# Patient Record
Sex: Male | Born: 1985
Health system: Southern US, Community
[De-identification: ages and names within clinical notes are randomized; demographics above are authoritative.]

## PROBLEM LIST (undated history)

## (undated) DIAGNOSIS — F909 Attention-deficit hyperactivity disorder, unspecified type: Secondary | ICD-10-CM

## (undated) DIAGNOSIS — F319 Bipolar disorder, unspecified: Secondary | ICD-10-CM

## (undated) HISTORY — PX: APPENDECTOMY: SHX54

## (undated) HISTORY — PX: WISDOM TOOTH EXTRACTION: SHX21

---
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
## 2006-10-22 ENCOUNTER — Emergency Department (HOSPITAL_COMMUNITY): Admission: EM | Admit: 2006-10-22 | Discharge: 2006-10-22 | Payer: Self-pay | Admitting: Emergency Medicine

## 2007-09-22 ENCOUNTER — Encounter: Payer: Self-pay | Admitting: Family Medicine

## 2007-12-04 ENCOUNTER — Emergency Department (HOSPITAL_COMMUNITY): Admission: EM | Admit: 2007-12-04 | Discharge: 2007-12-04 | Payer: Self-pay | Admitting: Emergency Medicine

## 2007-12-05 ENCOUNTER — Ambulatory Visit (HOSPITAL_COMMUNITY): Admission: RE | Admit: 2007-12-05 | Discharge: 2007-12-05 | Payer: Self-pay | Admitting: Specialist

## 2008-01-06 ENCOUNTER — Encounter: Admission: RE | Admit: 2008-01-06 | Discharge: 2008-03-15 | Payer: Self-pay | Admitting: Specialist

## 2008-07-13 ENCOUNTER — Ambulatory Visit: Payer: Self-pay | Admitting: Family Medicine

## 2008-07-13 DIAGNOSIS — F319 Bipolar disorder, unspecified: Secondary | ICD-10-CM

## 2008-07-13 DIAGNOSIS — F988 Other specified behavioral and emotional disorders with onset usually occurring in childhood and adolescence: Secondary | ICD-10-CM | POA: Insufficient documentation

## 2009-06-19 ENCOUNTER — Encounter (INDEPENDENT_AMBULATORY_CARE_PROVIDER_SITE_OTHER): Payer: Self-pay | Admitting: *Deleted

## 2009-06-19 DIAGNOSIS — F17200 Nicotine dependence, unspecified, uncomplicated: Secondary | ICD-10-CM

## 2009-08-09 ENCOUNTER — Ambulatory Visit: Payer: Self-pay | Admitting: Family Medicine

## 2009-08-09 DIAGNOSIS — K219 Gastro-esophageal reflux disease without esophagitis: Secondary | ICD-10-CM | POA: Insufficient documentation

## 2009-08-23 ENCOUNTER — Encounter: Payer: Self-pay | Admitting: *Deleted

## 2009-08-23 DIAGNOSIS — M25519 Pain in unspecified shoulder: Secondary | ICD-10-CM | POA: Insufficient documentation

## 2009-10-18 ENCOUNTER — Encounter: Admission: RE | Admit: 2009-10-18 | Discharge: 2009-11-29 | Payer: Self-pay | Admitting: Specialist

## 2010-06-04 ENCOUNTER — Emergency Department (HOSPITAL_COMMUNITY)
Admission: EM | Admit: 2010-06-04 | Discharge: 2010-06-04 | Payer: Self-pay | Source: Home / Self Care | Admitting: Emergency Medicine

## 2010-07-19 NOTE — Miscellaneous (Signed)
Summary: needs referral  Clinical Lists Changes his mom called asking for a referral to [facility] with Dr. Thomasena Edis. she tried to make the appt but he needs to be referred there first..Sally Caldwell RN  August 23, 2009 2:08 PM [facility] referral is in chart. THANKS  [name] MD  August 25, 2009 9:17 AM  Problems: Added new problem of SHOULDER PAIN, RIGHT (ICD-719.41) - Signed Orders: Added new Referral order of Orthopedic Referral (Ortho) - Signed      Complete Medication List: 1)  Amantadine Hcl 100 Mg Tabs (Amantadine hcl) .Marland Kitchen.. 1 by mouth once daily per psychiatry 2)  Concerta 54 Mg Cr-tabs (Methylphenidate hcl) .... 2 by mouth once daily per psychiatry 3)  Abilify 20 Mg Tabs (Aripiprazole) .Marland Kitchen.. 1 by mouth once daily per psychiatry   referral made with [facility].Loralee Pacas CMA  August 25, 2009 11:16 AM

## 2010-07-19 NOTE — Miscellaneous (Signed)
Summary: Tobacco Eric Logan  Clinical Lists Changes  Problems: Added new problem of TOBACCO [place] (ICD-305.1) 

## 2010-07-19 NOTE — Assessment & Plan Note (Signed)
Summary: CPE/KH   Vital Signs:  Patient profile:   25 year old male Height:      68 inches Weight:      190.2 pounds BMI:     29.02 Temp:     97.9 degrees F oral Pulse rate:   98 / minute BP sitting:   125 / 74  (left arm) Cuff size:   regular  Vitals Entered By: Gladstone Pih (August 09, 2009 8:49 AM) CC: CPE Is Patient Diabetic? No Pain Assessment Patient in pain? no        CC:  CPE.  History of Present Illness: here for check up has some right shoulder problems but is seeing an orthopedist for that noticed he has gained 33 pounds in last year---drinks at least one 2liter bottle of soda or green tea a day. Very ittle activity. Does complain of some epigastric pain at times, usually when her lays down to sleep. Notable eats right before bed time. Paini is localized to epigastric area, no nausea. he describes it as a "rib feels like it is moving or pressing inward". No SOB. Happens only at bedtime. Gone when he awakens. Being followed by psychiatry for mood disorder. Engineer, manufacturing systems)  Habits & Providers  Alcohol-Tobacco-Diet     Tobacco Status: current     Tobacco Counseling: to quit use of tobacco products     Other Tobacco cigar     Other per week 1  Current Medications (verified): 1)  Amantadine Hcl 100 Mg Tabs (Amantadine Hcl) .Marland Kitchen.. 1 By Mouth Once Daily Per Psychiatry 2)  Concerta 54 Mg Cr-Tabs (Methylphenidate Hcl) .... 2 By Mouth Once Daily Per Psychiatry 3)  Abilify 20 Mg Tabs (Aripiprazole) .Marland Kitchen.. 1 By Mouth Once Daily Per Psychiatry  Past History:  Past Medical History: Last updated: 07/13/2008 bipolar d/o, ADD and LD followed at [facility]  Past Surgical History: Last updated: 07/13/2008 fractured scapula  Review of Systems       The patient complains of weight gain.  The patient denies anorexia, fever, weight loss, chest pain, syncope, dyspnea on exertion, peripheral edema, prolonged cough, headaches, hemoptysis, abdominal pain,  incontinence, muscle weakness, and difficulty walking.    Physical Exam  General:  alert and well-developed.   Eyes:  vision grossly intact, pupils equal, and pupils round.   Ears:  R ear normal and L ear normal.   Nose:  no external deformity.   Mouth:  no exudate Neck:  supple, full ROM, no masses, no thyromegaly, and no carotid bruits.   Lungs:  normal respiratory effort and normal breath sounds.   Heart:  normal rate, regular rhythm, and no murmur.   Abdomen:  soft, no distention, no guarding, and no rigidity.  epigastric area is normal to exam--the area where he points to that corresponds to his pain is right over the GE junction. Genitalia:  deferred by pt Msk:  normal ROM, no joint swelling, and no joint warmth.   Extremities:  FROM x 4. normal symmetrical strength grossly Neurologic:  alert & oriented X3 and gait normal.   Skin:  complete skin exam deferred by pt. no worrisome lesions on hands, face trunk, arms Psych:  Oriented X3.  slightly flat affect. Very distractable (answers his phone during interview) Answers questions appropriately and co-operative.   Impression & Recommendations:  Problem # 1:  WELL ADULT EXAM (ICD-V70.0) Td is in date we discussed his weight and recommended decreasing his intake of soda and green tea--both from a weight  perspecitve and from a GERD prespective. I think the "rib moving" sensation he has at night is some mild reflux. Will try otc omeprazole oprn and lifestyle modificcation of diet. recommended more exercise  Complete Medication List: 1)  Amantadine Hcl 100 Mg Tabs (Amantadine hcl) .Marland Kitchen.. 1 by mouth once daily per psychiatry 2)  Concerta 54 Mg Cr-tabs (Methylphenidate hcl) .... 2 by mouth once daily per psychiatry 3)  Abilify 20 Mg Tabs (Aripiprazole) .Marland Kitchen.. 1 by mouth once daily per psychiatry [facility] - Est  18-39 yrs ([id number])   Impression & Recommendations:    Orders: [facility] - Est  18-39 yrs ([id number])     Complete Medication List: 1)   Amantadine Hcl 100 Mg Tabs (Amantadine hcl) .Marland Kitchen.. 1 by mouth once daily per psychiatry 2)  Concerta 54 Mg Cr-tabs (Methylphenidate hcl) .... 2 by mouth once daily per psychiatry 3)  Abilify 20 Mg Tabs (Aripiprazole) .Marland Kitchen.. 1 by mouth once daily per psychiatry

## 2010-07-26 ENCOUNTER — Encounter: Payer: Self-pay | Admitting: *Deleted

## 2010-09-20 ENCOUNTER — Telehealth: Payer: Self-pay | Admitting: Family Medicine

## 2010-09-20 NOTE — Telephone Encounter (Signed)
Wants to know what he can do for nausea

## 2010-09-20 NOTE — Telephone Encounter (Signed)
Pt states he has had nausea x 5 days. Denies vomiting.  States his "brain keeps going & going & going"  Attributes his nausea to the "brain thing" States eye twitching ( L  Eye) "I feel like I have a lot of adrenaline in me"  Has stopped all caffiene but no improvement. States he has not slept in days. States this has not happened before.   Area at hairline in the middle of forehead is throbbing. C/o tremors in hands and upper arms & thighs. I told him he needed to go to his mental health provider & the [facility] has walk ins 24/7. He had told me he could not reach his therapist today.     no abilify in 3 months. States "it does nothing for me" States he has had 2 people he know die in the past week & "can't stop thinking". Denies thoughts of suicide. Asked me to call his mom. She states she has been aware of this for 2 days & he has not stopped caffiene. One of the deaths was mom's friend & the other was a co-worker's boyfriend. States he is not close to either person.  States she thought he was calling for an appt here. Told her mental health was preferred & about the 24/7 walk in. She does not want to do this. States he is a hypochondriac. Says he used to see a therapist who is no longer there & when she gets off work she is going to find the person's number & see if he can be seen by that person. Called Brance back. He states he grew up with the man who overdosed & he does know him well. States this is the 2nd person he knew who overdosed.  States he cannot sleep because he keeps thinking about "every little thing in my life"  Has had diarrhea X5 days & dark urine. 4 stools so far today. States they look like "pond scum" States he is drinking plenty.  I explained to him that these symptoms are consistent with his bipolar condition & they will go away. Asked that he have his mom call me when she gets in which should be soon. He promised to call me back if he felt worse

## 2011-07-04 ENCOUNTER — Ambulatory Visit (INDEPENDENT_AMBULATORY_CARE_PROVIDER_SITE_OTHER): Payer: Medicaid Other | Admitting: Family Medicine

## 2011-07-04 ENCOUNTER — Other Ambulatory Visit: Payer: Self-pay

## 2011-07-04 ENCOUNTER — Ambulatory Visit (HOSPITAL_COMMUNITY)
Admission: RE | Admit: 2011-07-04 | Discharge: 2011-07-04 | Disposition: A | Discharge status: Home / Self Care | Payer: Medicaid Other | Source: Ambulatory Visit | Attending: Cardiology | Admitting: Cardiology

## 2011-07-04 VITALS — BP 127/79 | HR 89 | Temp 98.5°F | Ht 67.6 in | Wt 178.0 lb

## 2011-07-04 DIAGNOSIS — K5900 Constipation, unspecified: Secondary | ICD-10-CM | POA: Insufficient documentation

## 2011-07-04 DIAGNOSIS — R002 Palpitations: Secondary | ICD-10-CM

## 2011-07-04 NOTE — Assessment & Plan Note (Signed)
Discussed use of miralax, colace, and supportive measures for anal fissure and fiber supplements for ongoing maintenance of constipation.  Patient handout given.

## 2011-07-04 NOTE — Patient Instructions (Signed)
I will send EKG to [facility].  Your heart rate and EKG looked normal.  See info sheet on anal fissures and constipation

## 2011-07-04 NOTE — Assessment & Plan Note (Signed)
No palpitations or tachycardia noted on EKG or exam/history today.  Will forward to Rejeana Brock, NP at [facility] as requested.  No further workup or monitoring indicated.

## 2011-07-04 NOTE — Progress Notes (Signed)
  Subjective:    Patient ID: Eric Logan, male    DOB: 1986-03-17, 26 y.o.   MRN: [id number]  HPI Patient here for referral from [facility] to evaluate palpitations, the request an EKG. No recent change in medications, meds reviewed.  Palpitations:  Patient states he does not have any palpitations but frequently has a fast heart rate when seen.  States he has already taken his methylphenidate this morning as he does every morning.  No dyspnea at rest or on exertion.   No chest pain, edema.  Constipation:  Mom notes he currently has an anal fissure and frequently has constipation.  Patient reports frequent hard stools and low fiber diet.  Inquiring about what they can do.  No diarrhea currently, no abdominal pain, fever.  I have reviewed patient's  PMH, FH, and Social history and Medications as related to this visit.  Review of Systems See above.      Objective:   Physical Exam GEN: Alert & Oriented, No acute distress CV:  Regular Rate & Rhythm, no murmur Respiratory:  Normal work of breathing, CTAB Abd:  + BS, soft, no tenderness to palpation Ext: no pre-tibial edema  EKG: Rate 88 NSR,       Assessment & Plan:

## 2014-02-16 ENCOUNTER — Ambulatory Visit (INDEPENDENT_AMBULATORY_CARE_PROVIDER_SITE_OTHER): Payer: Medicare Other | Admitting: Family Medicine

## 2014-02-16 VITALS — BP 121/76 | HR 87 | Temp 98.1°F | Ht 68.0 in | Wt 216.5 lb

## 2014-02-16 DIAGNOSIS — F319 Bipolar disorder, unspecified: Secondary | ICD-10-CM

## 2014-02-16 DIAGNOSIS — M25511 Pain in right shoulder: Secondary | ICD-10-CM

## 2014-02-16 DIAGNOSIS — Z23 Encounter for immunization: Secondary | ICD-10-CM

## 2014-02-16 DIAGNOSIS — Z87891 Personal history of nicotine dependence: Secondary | ICD-10-CM

## 2014-02-16 DIAGNOSIS — J309 Allergic rhinitis, unspecified: Secondary | ICD-10-CM

## 2014-02-16 DIAGNOSIS — M25519 Pain in unspecified shoulder: Secondary | ICD-10-CM

## 2014-02-16 MED ORDER — FLUTICASONE PROPIONATE 50 MCG/ACT NA SUSP: 2.0000 | Freq: Every day | NASAL | Status: DC

## 2014-02-17 ENCOUNTER — Encounter: Payer: Self-pay | Admitting: Family Medicine

## 2014-02-17 DIAGNOSIS — Z87891 Personal history of nicotine dependence: Secondary | ICD-10-CM | POA: Insufficient documentation

## 2014-02-17 DIAGNOSIS — J309 Allergic rhinitis, unspecified: Secondary | ICD-10-CM | POA: Insufficient documentation

## 2014-02-17 NOTE — Assessment & Plan Note (Signed)
I think using over-the-counter Sudafed is 5 medicines orally. We'll also add fluticasone nasal spray see this improves his symptoms the

## 2014-02-17 NOTE — Assessment & Plan Note (Signed)
He is followed by psychiatry for this and his ADD and they're managing his medications.

## 2014-02-17 NOTE — Progress Notes (Signed)
   Subjective:    Patient ID: Eric Logan, male    DOB: Jul 13, 1985, 28 y.o.   MRN: [id number]  HPI Right shoulder pain. History of scapular fracture or several years ago and was seen by orthopedics. He evidently did 2 rounds of physical therapy but still has occasional posterior right shoulder pain. In the last month or so this is gotten worse. He has started doing a little bit of an exercise program under the tutelage of his roommate. Pain is posterior the top of shoulder blade. Does not radiate to arm or neck. No numbness or tingling in his hands. #2. History of bipolar disorder with attention deficit disorder followed by psychiatry. He continues to see them on a regular basis and they're making some medication changes but he cannot wear her the name of the medicine there starting him on. #3. Having occasional headaches, Baisley occurring the morning in the evening. It may be related to some nasal congestion. He's been using over-the-counter Sudafed with some relief.  Review of Systems No unusual weight change, fever, sweats, chills. No hallucinations.    Objective:   Physical Exam Vital signs are reviewed GENERAL: Well-developed male no acute distress Shoulder: Right. Rotator cuff strength is intact in all planes and normal range of motion. Mild tenderness to palpation over this period border of the scapula. I cannot reproduce his pain. Very slight asymmetry of the scapula with right scapulae more prominent medially but there is no true winging of the scapula. Distally he is neurovascularly intact in bilateral upper extremities. Marland Kitchen Psychiatric: Alert and oriented. He asked answers questions appropriately. No hallucinations. Speech is normal and fluency in content HEENT: Nasal mucosa is very slightly swollen no exudate. Sinuses are nontender to percussion. Oropharynx is clear without any sign of exudate. Neck is without lymphadenopathy. LUNGS: Clear to auscultation bilaterally CV: Regular  rate and rhythm no murmur       Assessment & Plan:

## 2014-02-17 NOTE — Assessment & Plan Note (Signed)
He seems pretty fixated on the shoulder pain. I think is musculoskeletal and minor but I think it will be relieved if he sees the orthopedist I will make that referral.

## 2014-02-18 ENCOUNTER — Telehealth: Payer: Self-pay | Admitting: *Deleted

## 2014-02-18 NOTE — Telephone Encounter (Signed)
Appointment set for Monday 03/07/2014 @ 10:30am with Dr. Thomasena Edis at [facility]. [address] phone# [phone]. Per patient call his aunt it ok to talk to her and let her know about the appoitnemnt

## 2014-02-23 NOTE — Telephone Encounter (Signed)
Called and informed aunt of appt info.  Altamese Dilling, BSN, RN-BC

## 2014-03-08 ENCOUNTER — Telehealth: Payer: Self-pay | Admitting: Family Medicine

## 2014-03-08 NOTE — Telephone Encounter (Signed)
Dear Eric Logan Team I am not sure what type of p is requesting. If it is a disability letter then  ,please ask him exactly what I'm supposed to base this on as he has no physical disabilities, is not malnourished, is not blind and is not missing any lambs. All of those diagnoses would warrant  A disability request letter for a food pantry-----but I can't find a diagnosis in his problem list that I can use.   It is unfortunate that he is having these issues but I cannot forge some type of medical documentation for him.   please let him know this.\ Eric Logan

## 2014-03-08 NOTE — Telephone Encounter (Signed)
Pt is receiving food stamps but they never last thru the month. He is going to the food bank but needs a letter say he could use the food pantry every month. He is using the [employer] at [employer] and [employer] the letter

## 2014-03-08 NOTE — Telephone Encounter (Signed)
Patient states that the letter is needed because he is on disability and has ADHD. States that his mother is the one who is requesting the letter for the food pantry for him.

## 2014-03-10 NOTE — Telephone Encounter (Signed)
ADHD does not qualify him.Neither does psychiatric disability. Sorry  [name]

## 2014-03-11 NOTE — Telephone Encounter (Signed)
Patient has been notified about this subject

## 2014-07-14 ENCOUNTER — Encounter: Payer: Self-pay | Admitting: Family Medicine

## 2014-07-14 ENCOUNTER — Ambulatory Visit (INDEPENDENT_AMBULATORY_CARE_PROVIDER_SITE_OTHER): Payer: Medicare Other | Admitting: Family Medicine

## 2014-07-14 VITALS — BP 138/81 | HR 84 | Temp 98.3°F | Ht 68.0 in | Wt 217.1 lb

## 2014-07-14 DIAGNOSIS — J020 Streptococcal pharyngitis: Secondary | ICD-10-CM

## 2014-07-14 DIAGNOSIS — J029 Acute pharyngitis, unspecified: Secondary | ICD-10-CM | POA: Diagnosis not present

## 2014-07-14 LAB — POCT RAPID STREP A (OFFICE): RAPID STREP A SCREEN: NEGATIVE

## 2014-07-14 MED ORDER — PENICILLIN V POTASSIUM 500 MG PO TABS: 500.0000 mg | ORAL_TABLET | Freq: Two times a day (BID) | ORAL | Status: DC

## 2014-07-14 NOTE — Progress Notes (Signed)
    Subjective   Eric Judie PetitM Logan is a 29 y.o. male that presents for a same day visit  SORE THROAT  Sore throat began 3-4 days ago. Pain is: sharp  Severity: 7-8 Medications tried: chloroseptic spray  Strep throat exposure: no STD exposure: no  Symptoms Fever: no Cough: yes Runny nose: no Muscle aches: no Swollen Glands: no Trouble breathing: no Drooling: accumulation of saliva due to sore throat but not drooling  Weight loss: no  Patient believes could be caused by: unsure   Review of Symptoms - see HPI PMH - Smoking status noted.      History  Substance Use Topics  . Smoking status: Former Games developermoker  . Smokeless tobacco: Never Used  . Alcohol Use: No    ROS Per HPI  Objective   BP 138/81 mmHg  Pulse 84  Temp(Src) 98.3 F (36.8 C) (Oral)  Ht 5\' 8"  (1.727 m)  Wt 217 lb 1.6 oz (98.476 kg)  BMI 33.02 kg/m2  General: Well appearing, NAD HEENT: no LAD, EOMI, PERRL, Tm's clear and intact, tonsillar exudates  Respiratory/Chest: CTAB, no wheezing  Cardiovascular: RRR, S1S2    Assessment and Plan   Please refer to problem based charting of assessment and plan

## 2014-07-14 NOTE — Patient Instructions (Signed)
Thank you for coming in,   Please let me know if you the antibiotics don't seem to be working. I have prescribed a 10 day supply.   If you have worsening pain or you can't swallow please give us a call.   You can continue to drink tea, gargle salt water and swallow honey.    Please feel free to call with any questions or concerns at any time, at [phone]. --Dr. Jordan LikesSchmitz

## 2014-07-15 DIAGNOSIS — J029 Acute pharyngitis, unspecified: Secondary | ICD-10-CM | POA: Insufficient documentation

## 2014-07-15 NOTE — Assessment & Plan Note (Signed)
Rapid strep neg but tonsillar exudates on exam. Will treat based on exam  - pen V K for 10 days  - encourage probiotics while taking ABX  - f/u if worsens or no improvement

## 2014-08-11 DIAGNOSIS — F902 Attention-deficit hyperactivity disorder, combined type: Secondary | ICD-10-CM | POA: Diagnosis not present

## 2014-08-11 DIAGNOSIS — F319 Bipolar disorder, unspecified: Secondary | ICD-10-CM | POA: Diagnosis not present

## 2014-12-07 ENCOUNTER — Telehealth: Payer: Self-pay | Admitting: Family Medicine

## 2014-12-07 DIAGNOSIS — F312 Bipolar disorder, current episode manic severe with psychotic features: Secondary | ICD-10-CM | POA: Diagnosis not present

## 2014-12-07 DIAGNOSIS — F902 Attention-deficit hyperactivity disorder, combined type: Secondary | ICD-10-CM | POA: Diagnosis not present

## 2014-12-07 DIAGNOSIS — F319 Bipolar disorder, unspecified: Secondary | ICD-10-CM | POA: Diagnosis not present

## 2014-12-07 NOTE — Telephone Encounter (Signed)
Mother called and would like a referral for her son to see a psychiatrist and [facility]. jw

## 2014-12-07 NOTE — Telephone Encounter (Signed)
Mother called back because she needs a referral to see [facility] Psychologist  to have a IQ test done. jw

## 2014-12-08 NOTE — Telephone Encounter (Signed)
He needs an appointment bwith me to discuss this THANKS! [name]

## 2014-12-09 NOTE — Telephone Encounter (Signed)
Spoke with patient and reminded him the appt for 12-21-14 with MD.  Also he told me that his aunt Kennon Rounds is now in charge of his affairs not his mom anymore.  Phone was disconnected.  Please have them fill out new forms reflecting this information.  Chaquita Basques,CMA

## 2014-12-09 NOTE — Telephone Encounter (Signed)
Pt calling back, says he was speaking with Dr. Donnetta Hail nurse and was disconnected, he will call back because he would prefer we talk to his aunt, Golden Circle, he will call back with her number.

## 2014-12-09 NOTE — Telephone Encounter (Signed)
Patient's aunt Golden Circle) calling to talk to Dr. Jennette Kettle, patient is supposed to be referred to have a IQ test done but she understands he needs to see Dr. Jennette Kettle for that and is agreeable. But patient also needs to switch psychiatrists for his monthly meds, he was asked to leave [facility] so patient needs a referral to [facility]. Cannot wait too long because patient only has 28 days of meds left and if he runs out he will have to go to ED for more meds. Please call Kennon Rounds so she can explain.

## 2014-12-09 NOTE — Telephone Encounter (Signed)
Will forward to Dr. Neal. Jazmin Hartsell,CMA  

## 2014-12-13 DIAGNOSIS — F902 Attention-deficit hyperactivity disorder, combined type: Secondary | ICD-10-CM | POA: Diagnosis not present

## 2014-12-13 DIAGNOSIS — F312 Bipolar disorder, current episode manic severe with psychotic features: Secondary | ICD-10-CM | POA: Diagnosis not present

## 2014-12-13 NOTE — Telephone Encounter (Signed)
Dear Eric Logan Team Insurance for behavioral health issues to see a psychiatrist does not usually need a referral. They should try calling the facility. What happened at [facility]? THANKS! Denny LevySara Jenascia Bumpass

## 2014-12-21 ENCOUNTER — Encounter: Payer: Self-pay | Admitting: Family Medicine

## 2014-12-21 ENCOUNTER — Ambulatory Visit (INDEPENDENT_AMBULATORY_CARE_PROVIDER_SITE_OTHER): Payer: Medicare Other | Admitting: Family Medicine

## 2014-12-21 VITALS — BP 133/82 | HR 88 | Temp 98.4°F | Ht 68.0 in | Wt 211.9 lb

## 2014-12-21 DIAGNOSIS — F319 Bipolar disorder, unspecified: Secondary | ICD-10-CM

## 2014-12-21 DIAGNOSIS — Z8659 Personal history of other mental and behavioral disorders: Secondary | ICD-10-CM

## 2014-12-21 NOTE — Telephone Encounter (Signed)
PT is scheduled to see Dr. Jennette KettleNeal on 12/21/14. Lamonte SakaiZimmerman Rumple, Camron Monday D, [place]

## 2014-12-22 DIAGNOSIS — Z8659 Personal history of other mental and behavioral disorders: Secondary | ICD-10-CM | POA: Insufficient documentation

## 2014-12-22 NOTE — Assessment & Plan Note (Signed)
I reviewed the available notes I have from Eric Logan's outpatient mental health providers. Unfortunately we don't have anything in recent years but I have his notes as a child. He's been in the system for most of his life. Various diagnoses but most consistent with adjustment disorder, oppositional defined disorder, attention deficit disorder. Certainly I think he needs to [facility]. I did call his therapist today and spoke with her while they were in clinic. She had one me to order some type of neuropsych testing to determine whether not he was on the autism spectrum. Given his age, I don't think that's going to be something easily determined. I would much rather get with his psychiatrist regarding these issues and I have conveyed this information to them. Greater than 50% of our 35 minute office visit was spent in counseling and education regarding these issues.

## 2014-12-22 NOTE — Assessment & Plan Note (Signed)
I discussed with him and he is on that given his current insurance, there is no need for referral. Unfortunately there are only a few psychiatrist's in the area who take Medicaid insurance. I recommend he not discontinue his current psychiatric care as I am unwilling to manage his complex psychiatric medication regimen and I do not think he needs to abruptly go off his medicines. He is not happy about having to [facility] with [facility], and I gave him a list of other options to explore.

## 2014-12-22 NOTE — Progress Notes (Signed)
   Subjective:    Patient ID: Cecile Hearingichard M Kahre, male    DOB: 06/21/1985, 29 y.o.   MRN: [id number]  HPI Here today with his on it, Lanetta InchSally Moore. They are requesting referral to get a new psychiatrist as he no longer wants to go to [facility]. He tells me that [facility] they're going to make some significant changes in his chronic mental health medications and he does not want to do that. His insurance is Medicaid. #2. He [facility] tear [facility]. She has told him that he needs to get some type of testing to evaluate whether or not he has Aspergers. Evidently he could qualify for some additional assistance through some state program if he has diagnosis that places him on the autism spectrum disorder. #3. Right now he's taking his medications regularly. He has another 2 weeks worth of medicines but does not want to go back to his previous psychiatrist. He denies any suicidal or homicidal ideation. He is not currently working outside the home.   Review of Systems No unusual weight change, fever, sweats, chills. See history of present illness.    Objective:   Physical Exam  Vital signs reviewed. GENERAL: Well-developed, well-nourished, no acute distress. CARDIOVASCULAR: Regular rate and rhythm no murmur gallop or rub LUNGS: Clear to auscultation bilaterally, no rales or wheeze. ABDOMEN: Soft positive bowel sounds NEURO: No gross focal neurological deficits. MSK: Movement of extremity x 4. PSYCHIATRIC: Alert and oriented 4. His speech at times is pressured and redundant. He gets quite excited when talking about certain subjects such as his current psychiatrist and they're plan to change his medication regimen. His thought process seems normal although at times he does perseverate.        Assessment & Plan:

## 2015-01-05 DIAGNOSIS — F902 Attention-deficit hyperactivity disorder, combined type: Secondary | ICD-10-CM | POA: Diagnosis not present

## 2015-01-05 DIAGNOSIS — F319 Bipolar disorder, unspecified: Secondary | ICD-10-CM | POA: Diagnosis not present

## 2015-03-29 DIAGNOSIS — F902 Attention-deficit hyperactivity disorder, combined type: Secondary | ICD-10-CM | POA: Diagnosis not present

## 2015-06-01 DIAGNOSIS — F902 Attention-deficit hyperactivity disorder, combined type: Secondary | ICD-10-CM | POA: Diagnosis not present

## 2015-06-01 DIAGNOSIS — F319 Bipolar disorder, unspecified: Secondary | ICD-10-CM | POA: Diagnosis not present

## 2015-06-21 DIAGNOSIS — F902 Attention-deficit hyperactivity disorder, combined type: Secondary | ICD-10-CM | POA: Diagnosis not present

## 2015-07-20 DIAGNOSIS — F902 Attention-deficit hyperactivity disorder, combined type: Secondary | ICD-10-CM | POA: Diagnosis not present

## 2015-07-20 DIAGNOSIS — F319 Bipolar disorder, unspecified: Secondary | ICD-10-CM | POA: Diagnosis not present

## 2015-08-25 DIAGNOSIS — F902 Attention-deficit hyperactivity disorder, combined type: Secondary | ICD-10-CM | POA: Diagnosis not present

## 2015-08-25 DIAGNOSIS — F319 Bipolar disorder, unspecified: Secondary | ICD-10-CM | POA: Diagnosis not present

## 2015-09-21 DIAGNOSIS — F902 Attention-deficit hyperactivity disorder, combined type: Secondary | ICD-10-CM | POA: Diagnosis not present

## 2015-10-24 DIAGNOSIS — F319 Bipolar disorder, unspecified: Secondary | ICD-10-CM | POA: Diagnosis not present

## 2015-10-24 DIAGNOSIS — F902 Attention-deficit hyperactivity disorder, combined type: Secondary | ICD-10-CM | POA: Diagnosis not present

## 2015-11-16 DIAGNOSIS — F902 Attention-deficit hyperactivity disorder, combined type: Secondary | ICD-10-CM | POA: Diagnosis not present

## 2015-12-27 DIAGNOSIS — F902 Attention-deficit hyperactivity disorder, combined type: Secondary | ICD-10-CM | POA: Diagnosis not present

## 2015-12-27 DIAGNOSIS — F319 Bipolar disorder, unspecified: Secondary | ICD-10-CM | POA: Diagnosis not present

## 2015-12-28 ENCOUNTER — Emergency Department (HOSPITAL_COMMUNITY)
Admission: EM | Admit: 2015-12-28 | Discharge: 2015-12-28 | Disposition: A | Discharge status: Home / Self Care | Payer: Medicare Other | Attending: Emergency Medicine | Admitting: Emergency Medicine

## 2015-12-28 ENCOUNTER — Emergency Department (HOSPITAL_COMMUNITY): Payer: Medicare Other

## 2015-12-28 ENCOUNTER — Encounter (HOSPITAL_COMMUNITY): Payer: Self-pay

## 2015-12-28 DIAGNOSIS — J029 Acute pharyngitis, unspecified: Secondary | ICD-10-CM | POA: Diagnosis not present

## 2015-12-28 DIAGNOSIS — Z79899 Other long term (current) drug therapy: Secondary | ICD-10-CM | POA: Diagnosis not present

## 2015-12-28 DIAGNOSIS — J040 Acute laryngitis: Secondary | ICD-10-CM | POA: Insufficient documentation

## 2015-12-28 DIAGNOSIS — F319 Bipolar disorder, unspecified: Secondary | ICD-10-CM | POA: Insufficient documentation

## 2015-12-28 DIAGNOSIS — J069 Acute upper respiratory infection, unspecified: Secondary | ICD-10-CM | POA: Insufficient documentation

## 2015-12-28 DIAGNOSIS — Z87891 Personal history of nicotine dependence: Secondary | ICD-10-CM | POA: Insufficient documentation

## 2015-12-28 HISTORY — DX: Bipolar disorder, unspecified: F31.9

## 2015-12-28 HISTORY — DX: Attention-deficit hyperactivity disorder, unspecified type: F90.9

## 2015-12-28 LAB — CBC WITH DIFFERENTIAL/PLATELET
BASOS ABS: 0 10*3/uL (ref 0.0–0.1)
BASOS PCT: 1 %
Eosinophils Absolute: 0.4 10*3/uL (ref 0.0–0.7)
Eosinophils Relative: 5 %
HEMATOCRIT: 44.4 % (ref 39.0–52.0)
Hemoglobin: 15.4 g/dL (ref 13.0–17.0)
Lymphocytes Relative: 40 %
Lymphs Abs: 3.2 10*3/uL (ref 0.7–4.0)
MCH: 29.2 pg (ref 26.0–34.0)
MCHC: 34.7 g/dL (ref 30.0–36.0)
MCV: 84.3 fL (ref 78.0–100.0)
MONO ABS: 0.5 10*3/uL (ref 0.1–1.0)
Monocytes Relative: 6 %
NEUTROS ABS: 3.8 10*3/uL (ref 1.7–7.7)
NEUTROS PCT: 48 %
Platelets: 325 10*3/uL (ref 150–400)
RBC: 5.27 MIL/uL (ref 4.22–5.81)
RDW: 14 % (ref 11.5–15.5)
WBC: 7.9 10*3/uL (ref 4.0–10.5)

## 2015-12-28 LAB — COMPREHENSIVE METABOLIC PANEL
ALBUMIN: 4.7 g/dL (ref 3.5–5.0)
ALT: 17 U/L (ref 17–63)
AST: 21 U/L (ref 15–41)
Alkaline Phosphatase: 71 U/L (ref 38–126)
Anion gap: 8 (ref 5–15)
BILIRUBIN TOTAL: 1.8 mg/dL — AB (ref 0.3–1.2)
BUN: 12 mg/dL (ref 6–20)
CHLORIDE: 103 mmol/L (ref 101–111)
CO2: 26 mmol/L (ref 22–32)
Calcium: 9.2 mg/dL (ref 8.9–10.3)
Creatinine, Ser: 1.09 mg/dL (ref 0.61–1.24)
GFR calc Af Amer: >60 mL/min (ref >60)
GFR calc non Af Amer: >60 mL/min (ref >60)
GLUCOSE: 100 mg/dL — AB (ref 65–99)
POTASSIUM: 3.6 mmol/L (ref 3.5–5.1)
Sodium: 137 mmol/L (ref 135–145)
TOTAL PROTEIN: 7.7 g/dL (ref 6.5–8.1)

## 2015-12-28 LAB — RAPID STREP SCREEN (MED CTR MEBANE ONLY): Streptococcus, Group A Screen (Direct): NEGATIVE

## 2015-12-28 MED ORDER — DIPHENHYDRAMINE HCL 50 MG/ML IJ SOLN
25.0000 mg | Freq: Once | INTRAMUSCULAR | Status: AC
Start: 2015-12-28 — End: 2015-12-28
  Administered 2015-12-28: 25 mg via INTRAVENOUS
  Filled 2015-12-28: qty 1

## 2015-12-28 MED ORDER — DIPHENHYDRAMINE HCL 25 MG PO TABS: 25.0000 mg | ORAL_TABLET | Freq: Four times a day (QID) | ORAL | Status: DC

## 2015-12-28 MED ORDER — IOPAMIDOL (ISOVUE-300) INJECTION 61%
75.0000 mL | Freq: Once | INTRAVENOUS | Status: AC | PRN
  Administered 2015-12-28: 75 mL via INTRAVENOUS

## 2015-12-28 MED ORDER — DEXAMETHASONE SODIUM PHOSPHATE 10 MG/ML IJ SOLN
10.0000 mg | Freq: Four times a day (QID) | INTRAMUSCULAR | Status: DC
  Administered 2015-12-28: 10 mg via INTRAVENOUS
  Filled 2015-12-28: qty 1

## 2015-12-28 MED ORDER — PREDNISONE 20 MG PO TABS: ORAL_TABLET | ORAL | Status: DC

## 2015-12-28 MED ORDER — IBUPROFEN 600 MG PO TABS: 600.0000 mg | ORAL_TABLET | Freq: Four times a day (QID) | ORAL | Status: DC | PRN

## 2015-12-28 NOTE — Discharge Instructions (Signed)
Upper Respiratory Infection, Adult  Most upper respiratory infections (URIs) are a viral infection of the air passages leading to the lungs. A URI affects the nose, throat, and upper air passages. The most common type of URI is nasopharyngitis and is typically referred to as "the common cold."  URIs run their course and usually go away on their own. Most of the time, a URI does not require medical attention, but sometimes a bacterial infection in the upper airways can follow a viral infection. This is called a secondary infection. Sinus and middle ear infections are common types of secondary upper respiratory infections.  Bacterial pneumonia can also complicate a URI. A URI can worsen asthma and chronic obstructive pulmonary disease (COPD). Sometimes, these complications can require emergency medical care and may be life threatening.   CAUSES  Almost all URIs are caused by viruses. A virus is a type of germ and can spread from one person to another.   RISKS FACTORS  You may be at risk for a URI if:    You smoke.    You have chronic heart or lung disease.   You have a weakened defense (immune) system.    You are very young or very old.    You have nasal allergies or asthma.   You work in crowded or poorly ventilated areas.   You work in health care facilities or schools.  SIGNS AND SYMPTOMS   Symptoms typically develop 2-3 days after you come in contact with a cold virus. Most viral URIs last 7-10 days. However, viral URIs from the influenza virus (flu virus) can last 14-18 days and are typically more severe. Symptoms may include:    Runny or stuffy (congested) nose.    Sneezing.    Cough.    Sore throat.    Headache.    Fatigue.    Fever.    Loss of appetite.    Pain in your forehead, behind your eyes, and over your cheekbones (sinus pain).   Muscle aches.   DIAGNOSIS   Your health care provider may diagnose a URI by:   Physical exam.   Tests to check that your symptoms are not due to  another condition such as:   Strep throat.   Sinusitis.   Pneumonia.   Asthma.  TREATMENT   A URI goes away on its own with time. It cannot be cured with medicines, but medicines may be prescribed or recommended to relieve symptoms. Medicines may help:   Reduce your fever.   Reduce your cough.   Relieve nasal congestion.  HOME CARE INSTRUCTIONS    Take medicines only as directed by your health care provider.    Gargle warm saltwater or take cough drops to comfort your throat as directed by your health care provider.   Use a warm mist humidifier or inhale steam from a shower to increase air moisture. This may make it easier to breathe.   Drink enough fluid to keep your urine clear or pale yellow.    Eat soups and other clear broths and maintain good nutrition.    Rest as needed.    Return to work when your temperature has returned to normal or as your health care provider advises. You may need to stay home longer to avoid infecting others. You can also use a face mask and careful hand washing to prevent spread of the virus.   Increase the usage of your inhaler if you have asthma.    Do not   use any tobacco products, including cigarettes, chewing tobacco, or electronic cigarettes. If you need help quitting, ask your health care provider.  PREVENTION   The best way to protect yourself from getting a cold is to practice good hygiene.    Avoid oral or hand contact with people with cold symptoms.    Wash your hands often if contact occurs.   There is no clear evidence that vitamin C, vitamin E, echinacea, or exercise reduces the chance of developing a cold. However, it is always recommended to get plenty of rest, exercise, and practice good nutrition.   SEEK MEDICAL CARE IF:    You are getting worse rather than better.    Your symptoms are not controlled by medicine.    You have chills.   You have worsening shortness of breath.   You have brown or red mucus.   You have yellow or brown nasal  discharge.   You have pain in your face, especially when you bend forward.   You have a fever.   You have swollen neck glands.   You have pain while swallowing.   You have white areas in the back of your throat.  SEEK IMMEDIATE MEDICAL CARE IF:    You have severe or persistent:    Headache.    Ear pain.    Sinus pain.    Chest pain.   You have chronic lung disease and any of the following:    Wheezing.    Prolonged cough.    Coughing up blood.    A change in your usual mucus.   You have a stiff neck.   You have changes in your:    Vision.    Hearing.    Thinking.    Mood.  MAKE SURE YOU:    Understand these instructions.   Will watch your condition.   Will get help right away if you are not doing well or get worse.     This information is not intended to replace advice given to you by your health care provider. Make sure you discuss any questions you have with your health care provider.     Document Released: 11/27/2000 Document Revised: 10/18/2014 Document Reviewed: 09/08/2013  Elsevier Interactive Patient Education 2016 [employer]  Laryngitis  Laryngitis is inflammation of your vocal cords. This causes hoarseness, coughing, loss of voice, sore throat, or a dry throat. Your vocal cords are two bands of muscles that are found in your throat. When you speak, these cords come together and vibrate. These vibrations come out through your mouth as sound. When your vocal cords are inflamed, your voice sounds different.  Laryngitis can be temporary (acute) or long-term (chronic). Most cases of acute laryngitis improve with time. Chronic laryngitis is laryngitis that lasts for more than three weeks.  CAUSES  Acute laryngitis may be caused by:   A viral infection.   Lots of talking, yelling, or singing. This is also called vocal strain.   Bacterial infections.  Chronic laryngitis may be caused by:   Vocal strain.   Injury to your vocal cords.   Acid reflux (gastroesophageal reflux disease or  GERD).   Allergies.   Sinus infection.   Smoking.   Alcohol abuse.   Breathing in chemicals or dust.   Growths on the vocal cords.  RISK FACTORS  Risk factors for laryngitis include:   Smoking.   Alcohol abuse.   Having allergies.  SIGNS AND SYMPTOMS  Symptoms of laryngitis may include:     Low, hoarse voice.   Loss of voice.   Dry cough.   Sore throat.   Stuffy nose.  DIAGNOSIS  Laryngitis may be diagnosed by:   Physical exam.   Throat culture.   Blood test.   Laryngoscopy. This procedure allows your health care provider to look at your vocal cords with a mirror or viewing tube.  TREATMENT  Treatment for laryngitis depends on what is causing it. Usually, treatment involves resting your voice and using medicines to soothe your throat. However, if your laryngitis is caused by a bacterial infection, you may need to take antibiotic medicine. If your laryngitis is caused by a growth, you may need to have a procedure to remove it.  HOME CARE INSTRUCTIONS   Drink enough fluid to keep your urine clear or pale yellow.   Breathe in moist air. Use a humidifier if you live in a dry climate.   Take medicines only as directed by your health care provider.   If you were prescribed an antibiotic medicine, finish it all even if you start to feel better.   Do not smoke cigarettes or electronic cigarettes. If you need help quitting, ask your health care provider.   Talk as little as possible. Also avoid whispering, which can cause vocal strain.   Write instead of talking. Do this until your voice is back to normal.  SEEK MEDICAL CARE IF:   You have a fever.   You have increasing pain.   You have difficulty swallowing.  SEEK IMMEDIATE MEDICAL CARE IF:   You cough up blood.   You have trouble breathing.     This information is not intended to replace advice given to you by your health care provider. Make sure you discuss any questions you have with your health care provider.     Document Released: 06/03/2005  Document Revised: 06/24/2014 Document Reviewed: 11/16/2013  Elsevier Interactive Patient Education 2016 [employer]

## 2015-12-28 NOTE — ED Notes (Signed)
Patient d/c'd self care.  F/U and medications reviewed.  Patient verbalized understanding. 

## 2015-12-28 NOTE — ED Notes (Signed)
Patient transported to CT 

## 2015-12-28 NOTE — ED Provider Notes (Signed)
CSN: [id number]     Arrival date & time 12/28/15  0432 History   First MD Initiated Contact with Patient 12/28/15 0443     Chief Complaint  Patient presents with  . Sore Throat     (Consider location/radiation/quality/duration/timing/severity/associated sxs/prior Treatment) HPI  Patient presents with sore throat for 3 days. He's had gradual voice changes and pain with swallowing. Denies any fever or chills. States he had a episode where he felt that his throat was closing but that is improved. Denies cough, facial swelling, rash. No new medications or known exposures. Past Medical History  Diagnosis Date  . Bipolar 1 disorder (HCC)   . ADHD (attention deficit hyperactivity disorder)    Past Surgical History  Procedure Laterality Date  . Appendectomy     No family history on file. Social History  Substance Use Topics  . Smoking status: Former Games developermoker  . Smokeless tobacco: Never Used  . Alcohol Use: No     Comment: ocasional    Review of Systems  Constitutional: Negative for fever, chills and fatigue.  HENT: Positive for sore throat and voice change. Negative for congestion, facial swelling and trouble swallowing.   Respiratory: Positive for shortness of breath. Negative for wheezing and stridor.   Cardiovascular: Negative for chest pain.  Gastrointestinal: Negative for nausea, vomiting, abdominal pain, diarrhea and constipation.  Musculoskeletal: Negative for back pain, neck pain and neck stiffness.  Skin: Negative for rash and wound.  Neurological: Negative for dizziness, weakness, light-headedness, numbness and headaches.  Psychiatric/Behavioral: The patient is nervous/anxious.   All other systems reviewed and are negative.     Allergies  Review of patient's allergies indicates no known allergies.  Home Medications   Prior to Admission medications   Medication Sig Start Date End Date Taking? Authorizing Provider  amantadine (SYMMETREL) 100 MG capsule Take 100 mg by  mouth daily. 12/27/15  Yes Historical Provider, MD  ARIPiprazole (ABILIFY) 10 MG tablet Take 10 mg by mouth daily. 12/27/15  Yes Historical Provider, MD  methylphenidate 36 MG PO CR tablet Take 36 mg by mouth 2 (two) times daily. 12/27/15  Yes Historical Provider, MD  OVER THE COUNTER MEDICATION Take 1 each by mouth daily as needed (stress).   Yes Historical Provider, MD  phenylephrine (SUDAFED PE) 10 MG TABS tablet Take 10 mg by mouth every 4 (four) hours as needed (congestion).   Yes Historical Provider, MD  diphenhydrAMINE (BENADRYL) 25 MG tablet Take 1 tablet (25 mg total) by mouth every 6 (six) hours. 12/28/15   Loren Raceravid Aleila Syverson, MD  fluticasone (FLONASE) 50 MCG/ACT nasal spray Place 2 sprays into both nostrils daily. Patient not taking: Reported on 12/28/2015 02/16/14   Nestor RampSara L Neal, MD  ibuprofen (ADVIL,MOTRIN) 600 MG tablet Take 1 tablet (600 mg total) by mouth every 6 (six) hours as needed. 12/28/15   Loren Raceravid Shoaib Siefker, MD  predniSONE (DELTASONE) 20 MG tablet 3 tabs po day one, then 2 po daily x 4 days 12/28/15   Loren Raceravid Donya Hitch, MD   BP 115/73 mmHg  Pulse 91  Temp(Src) 97.7 F (36.5 C)  Resp 18  SpO2 100% Physical Exam  Constitutional: He is oriented to person, place, and time. He appears well-developed and well-nourished. No distress.  HENT:  Head: Normocephalic and atraumatic.  Mouth/Throat: Oropharynx is clear and moist. No oropharyngeal exudate.  Mildly erythematous oropharynx with bilateral tonsillar enlargement. No exudates. No intraoral edema.  Eyes: EOM are normal. Pupils are equal, round, and reactive to light.  Neck: Normal range  of motion. Neck supple.  Cardiovascular: Normal rate and regular rhythm.  Exam reveals no gallop and no friction rub.   No murmur heard. Pulmonary/Chest: Effort normal and breath sounds normal. No stridor. No respiratory distress. He has no wheezes. He has no rales. He exhibits no tenderness.  No respiratory distress  Abdominal: Soft. Bowel sounds are  normal. He exhibits no distension and no mass. There is no tenderness. There is no rebound and no guarding.  Musculoskeletal: Normal range of motion. He exhibits no edema or tenderness.  Neurological: He is alert and oriented to person, place, and time.  Moves all extremities without deficit. Sensation is fully intact. Patient intermittently speaks with clear voice and then raspy voice.  Skin: Skin is warm and dry. No rash noted. No erythema.  Psychiatric: His behavior is normal.  Anxious  Nursing note and vitals reviewed.   ED Course  Procedures (including critical care time) Labs Review Labs Reviewed  COMPREHENSIVE METABOLIC PANEL - Abnormal; Notable for the following:    Glucose, Bld 100 (*)    Total Bilirubin 1.8 (*)    All other components within normal limits  RAPID STREP SCREEN (NOT AT [facility])  CULTURE, GROUP A STREP ([facility])  CBC WITH DIFFERENTIAL/PLATELET    Imaging Review Ct Soft Tissue Neck W Contrast  12/28/2015  CLINICAL DATA:  Worsening sore throat for 3 days, worse when supine. EXAM: CT NECK WITH CONTRAST TECHNIQUE: Multidetector CT imaging of the neck was performed using the standard protocol following the bolus administration of intravenous contrast. CONTRAST:  75mL ISOVUE-300 IOPAMIDOL (ISOVUE-300) INJECTION 61% COMPARISON:  None. FINDINGS: Pharynx and larynx: Normal appearance of the pharynx and larynx. Streak artifact from dental amalgam limits assessment of tongue. Salivary glands: Less than 1 cm bilateral parotid lymph nodes. Mildly atrophic submandibular glands. Thyroid: Normal. Lymph nodes: No lymphadenopathy by CT size criteria. Vascular: Normal. Limited intracranial: Normal. LEFT vertebral artery likely terminates in the posterior inferior cerebellar artery. Visualized orbits: Normal. Mastoids and visualized paranasal sinuses: Well-aerated. Skeleton: Normal. Mild broad cervical thoracic levoscoliosis could be positional. Upper chest: Lung apices are clear. IMPRESSION:  Negative CT neck with contrast. Electronically Signed   By: Awilda Metro M.D.   On: 12/28/2015 06:22   I have personally reviewed and evaluated these images and lab results as part of my medical decision-making.   EKG Interpretation None      MDM   Final diagnoses:  URI (upper respiratory infection)  Laryngitis    Patient is afebrile with a normal white blood cell count. Strep test is negative. CT of the neck is without any acute findings. Patient's been observed in the emergency department for 2-1/2 hours. He has no airway compromise. There is intermittent changes in his voice which appear possibly volitional. Likely has upper respiratory infection. Low suspicion for epiglottitis, abscess.    Loren Racer, MD 12/28/15 [phone]

## 2015-12-28 NOTE — ED Notes (Addendum)
Patient states that sore throat began x3 days ago.  Patient states that pain has gotten progressively worse and "feel like my throat is closing" Patient states when he is lying flat that the pain is worse.  Denies fevers, Denies nausea and vomiting.  Patient breathing even and unlabored.

## 2015-12-30 LAB — CULTURE, GROUP A STREP (THRC)

## 2016-01-23 DIAGNOSIS — F319 Bipolar disorder, unspecified: Secondary | ICD-10-CM | POA: Diagnosis not present

## 2016-01-23 DIAGNOSIS — F902 Attention-deficit hyperactivity disorder, combined type: Secondary | ICD-10-CM | POA: Diagnosis not present

## 2016-02-20 DIAGNOSIS — F902 Attention-deficit hyperactivity disorder, combined type: Secondary | ICD-10-CM | POA: Diagnosis not present

## 2016-03-21 DIAGNOSIS — F902 Attention-deficit hyperactivity disorder, combined type: Secondary | ICD-10-CM | POA: Diagnosis not present

## 2016-03-21 DIAGNOSIS — F319 Bipolar disorder, unspecified: Secondary | ICD-10-CM | POA: Diagnosis not present

## 2016-04-17 DIAGNOSIS — F902 Attention-deficit hyperactivity disorder, combined type: Secondary | ICD-10-CM | POA: Diagnosis not present

## 2016-04-17 DIAGNOSIS — F319 Bipolar disorder, unspecified: Secondary | ICD-10-CM | POA: Diagnosis not present

## 2016-05-16 DIAGNOSIS — F319 Bipolar disorder, unspecified: Secondary | ICD-10-CM | POA: Diagnosis not present

## 2016-05-16 DIAGNOSIS — F902 Attention-deficit hyperactivity disorder, combined type: Secondary | ICD-10-CM | POA: Diagnosis not present

## 2016-06-14 DIAGNOSIS — F902 Attention-deficit hyperactivity disorder, combined type: Secondary | ICD-10-CM | POA: Diagnosis not present

## 2016-07-11 DIAGNOSIS — F319 Bipolar disorder, unspecified: Secondary | ICD-10-CM | POA: Diagnosis not present

## 2016-07-11 DIAGNOSIS — F902 Attention-deficit hyperactivity disorder, combined type: Secondary | ICD-10-CM | POA: Diagnosis not present

## 2016-08-08 DIAGNOSIS — F319 Bipolar disorder, unspecified: Secondary | ICD-10-CM | POA: Diagnosis not present

## 2016-08-08 DIAGNOSIS — F902 Attention-deficit hyperactivity disorder, combined type: Secondary | ICD-10-CM | POA: Diagnosis not present

## 2016-09-01 ENCOUNTER — Ambulatory Visit (HOSPITAL_COMMUNITY)
Admission: AD | Admit: 2016-09-01 | Discharge: 2016-09-01 | Disposition: A | Discharge status: Home / Self Care | Payer: Medicare Other | Attending: Psychiatry | Admitting: Psychiatry

## 2016-09-01 ENCOUNTER — Encounter (HOSPITAL_COMMUNITY): Payer: Self-pay

## 2016-09-01 ENCOUNTER — Emergency Department (HOSPITAL_COMMUNITY)
Admission: EM | Admit: 2016-09-01 | Discharge: 2016-09-02 | Disposition: A | Discharge status: Home / Self Care | Payer: Medicare Other | Attending: Emergency Medicine | Admitting: Emergency Medicine

## 2016-09-01 DIAGNOSIS — F319 Bipolar disorder, unspecified: Secondary | ICD-10-CM | POA: Diagnosis not present

## 2016-09-01 DIAGNOSIS — R45851 Suicidal ideations: Secondary | ICD-10-CM | POA: Insufficient documentation

## 2016-09-01 DIAGNOSIS — F909 Attention-deficit hyperactivity disorder, unspecified type: Secondary | ICD-10-CM | POA: Insufficient documentation

## 2016-09-01 DIAGNOSIS — F902 Attention-deficit hyperactivity disorder, combined type: Secondary | ICD-10-CM | POA: Diagnosis not present

## 2016-09-01 DIAGNOSIS — F3131 Bipolar disorder, current episode depressed, mild: Secondary | ICD-10-CM

## 2016-09-01 DIAGNOSIS — F329 Major depressive disorder, single episode, unspecified: Secondary | ICD-10-CM | POA: Diagnosis not present

## 2016-09-01 DIAGNOSIS — F312 Bipolar disorder, current episode manic severe with psychotic features: Secondary | ICD-10-CM | POA: Diagnosis present

## 2016-09-01 DIAGNOSIS — Z87891 Personal history of nicotine dependence: Secondary | ICD-10-CM | POA: Diagnosis not present

## 2016-09-01 DIAGNOSIS — Z79899 Other long term (current) drug therapy: Secondary | ICD-10-CM | POA: Diagnosis not present

## 2016-09-01 DIAGNOSIS — Z046 Encounter for general psychiatric examination, requested by authority: Secondary | ICD-10-CM | POA: Diagnosis present

## 2016-09-01 LAB — CBC
HCT: 45.5 % (ref 39.0–52.0)
Hemoglobin: 15.5 g/dL (ref 13.0–17.0)
MCH: 29 pg (ref 26.0–34.0)
MCHC: 34.1 g/dL (ref 30.0–36.0)
MCV: 85.2 fL (ref 78.0–100.0)
PLATELETS: 363 10*3/uL (ref 150–400)
RBC: 5.34 MIL/uL (ref 4.22–5.81)
RDW: 14.3 % (ref 11.5–15.5)
WBC: 10.6 10*3/uL — ABNORMAL HIGH (ref 4.0–10.5)

## 2016-09-01 LAB — COMPREHENSIVE METABOLIC PANEL
ALT: 20 U/L (ref 17–63)
AST: 27 U/L (ref 15–41)
Albumin: 4.5 g/dL (ref 3.5–5.0)
Alkaline Phosphatase: 43 U/L (ref 38–126)
Anion gap: 7 (ref 5–15)
BUN: 20 mg/dL (ref 6–20)
CALCIUM: 9.6 mg/dL (ref 8.9–10.3)
CHLORIDE: 107 mmol/L (ref 101–111)
CO2: 25 mmol/L (ref 22–32)
CREATININE: 0.94 mg/dL (ref 0.61–1.24)
Glucose, Bld: 101 mg/dL — ABNORMAL HIGH (ref 65–99)
Potassium: 4.3 mmol/L (ref 3.5–5.1)
Sodium: 139 mmol/L (ref 135–145)
TOTAL PROTEIN: 7.4 g/dL (ref 6.5–8.1)
Total Bilirubin: 1.1 mg/dL (ref 0.3–1.2)

## 2016-09-01 LAB — ACETAMINOPHEN LEVEL: Acetaminophen (Tylenol), Serum: <10 ug/mL — ABNORMAL LOW (ref 10–30)

## 2016-09-01 LAB — ETHANOL

## 2016-09-01 LAB — SALICYLATE LEVEL

## 2016-09-01 NOTE — H&P (Signed)
Behavioral Health Medical Screening Exam  Eric Logan is an 31 y.o. male.  Total Time spent with patient: 20 minutes  Psychiatric Specialty Exam: Physical Exam  Constitutional: He is oriented to person, place, and time. He appears well-developed and well-nourished. No distress.  HENT:  Head: Normocephalic and atraumatic.  Right Ear: External ear normal.  Left Ear: External ear normal.  Eyes: Conjunctivae are normal. Pupils are equal, round, and reactive to light. Right eye exhibits no discharge. Left eye exhibits no discharge. No scleral icterus.  Neck: Normal range of motion.  Cardiovascular: Normal rate, regular rhythm and normal heart sounds.   Respiratory: Effort normal and breath sounds normal. No respiratory distress.  Musculoskeletal: Normal range of motion.  Neurological: He is alert and oriented to person, place, and time.  Skin: Skin is warm and dry. He is not diaphoretic.  Psychiatric: His mood appears anxious. His affect is blunt. His affect is not labile. His speech is rapid and/or pressured. He is not agitated, not aggressive and not actively hallucinating. Thought content is delusional. Thought content is not paranoid. Cognition and memory are normal. He expresses impulsivity and inappropriate judgment. He exhibits a depressed mood. He expresses suicidal ideation. He expresses no homicidal ideation. He expresses no suicidal plans. He is inattentive.    Review of Systems  Constitutional: Negative.   HENT: Negative.   Eyes: Negative for blurred vision, double vision and photophobia.  Respiratory: Negative.   Cardiovascular: Negative.   Gastrointestinal: Negative.   Genitourinary: Negative.   Musculoskeletal: Negative.   Skin: Negative.   Neurological: Positive for headaches. Negative for dizziness, tingling, speech change and seizures.  Endo/Heme/Allergies: Negative.   Psychiatric/Behavioral: Positive for depression, substance abuse and suicidal ideas. Negative  for hallucinations and memory loss. The patient is nervous/anxious and has insomnia.        Reports that he has not used any illicit drugs or alcohol in atleast one month. Reports that he felt suicidal earlier today and was planning to shoot himself, but decided not to due to religious beliefs. Reports that his friends took the gun away and have hidden it.     Blood pressure 116/66, pulse 79, temperature 97.6 F (36.4 C), temperature source Oral, resp. rate 18, SpO2 97 %.There is no height or weight on file to calculate BMI.  General Appearance: Disheveled  Eye Contact:  Fair  Speech:  Pressured  Volume:  Normal  Mood:  Anxious, Depressed, Hopeless, Irritable and Worthless  Affect:  Blunt  Thought Process:  Disorganized  Orientation:  Full (Time, Place, and Person)  Thought Content:  Delusions and Hallucinations: None  Suicidal Thoughts:  Yes.  without intent/plan  Homicidal Thoughts:  No  Memory:  Immediate;   Good Recent;   Good Remote;   Good  Judgement:  Impaired  Insight:  Lacking  Psychomotor Activity:  Restlessness  Concentration: Concentration: Poor  Recall:  Poor  Fund of Knowledge:Fair  Language: Fair  Akathisia:  No  Handed:  Right  AIMS (if indicated):     Assets:  Communication Skills Desire for Improvement Housing Physical Health Social Support  Sleep:       Musculoskeletal: Strength & Muscle Tone: within normal limits Gait & Station: normal   Blood pressure 116/66, pulse 79, temperature 97.6 F (36.4 C), temperature source Oral, resp. rate 18, SpO2 97 %.  Recommendations:  Based on my evaluation the patient does not appear to have an emergency medical condition. Plan for inpatient admission after medical clearance.  Eric Logan  Eric Dada, NP 09/01/2016, 10:12 PM

## 2016-09-01 NOTE — ED Provider Notes (Signed)
[facility]-EMERGENCY DEPT Provider Note   CSN: [id number] Arrival date & time: 09/01/16  2225   By signing my name below, I, Teofilo Pod, attest that this documentation has been prepared under the direction and in the presence of Elpidio Anis, PA-C. Electronically Signed: Teofilo Pod, ED Scribe. 09/01/2016. 11:54 PM.   History   Chief Complaint Chief Complaint  Patient presents with  . Medical Clearance   The history is provided by the patient. No language interpreter was used.   HPI Comments:  Eric Logan is a 31 y.o. male with PMHx of ADHD and bipolar 1 who presents to the Emergency Department, here for medical clearance after being referred here by behavior health earlier today. Pt states that his friends dropped him off there to get help. Pt takes multiple medications and has not missed any doses. Pt is a former smoker, and has not smoked or drank EtOH in over a month. Pt complains of associated headache. Pt denies SI/HI.   Past Medical History:  Diagnosis Date  . ADHD (attention deficit hyperactivity disorder)   . Bipolar 1 disorder [facility])     Patient Active Problem List   Diagnosis Date Noted  . History of oppositional defiant disorder 12/22/2014  . Former smoker 02/17/2014  . SHOULDER PAIN, RIGHT 08/23/2009  . Bipolar disorder (HCC) 07/13/2008  . ADD 07/13/2008    Past Surgical History:  Procedure Laterality Date  . APPENDECTOMY         Home Medications    Prior to Admission medications   Medication Sig Start Date End Date Taking? Authorizing Provider  amantadine (SYMMETREL) 100 MG capsule Take 100 mg by mouth daily. 12/27/15   Historical Provider, MD  ARIPiprazole (ABILIFY) 10 MG tablet Take 10 mg by mouth daily. 12/27/15   Historical Provider, MD  diphenhydrAMINE (BENADRYL) 25 MG tablet Take 1 tablet (25 mg total) by mouth every 6 (six) hours. 12/28/15   Loren Racer, MD  fluticasone (FLONASE) 50 MCG/ACT nasal spray Place 2 sprays into both  nostrils daily. Patient not taking: Reported on 12/28/2015 02/16/14   Nestor Ramp, MD  ibuprofen (ADVIL,MOTRIN) 600 MG tablet Take 1 tablet (600 mg total) by mouth every 6 (six) hours as needed. 12/28/15   Loren Racer, MD  methylphenidate 36 MG PO CR tablet Take 36 mg by mouth 2 (two) times daily. 12/27/15   Historical Provider, MD  OVER THE COUNTER MEDICATION Take 1 each by mouth daily as needed (stress).    Historical Provider, MD  phenylephrine (SUDAFED PE) 10 MG TABS tablet Take 10 mg by mouth every 4 (four) hours as needed (congestion).    Historical Provider, MD  predniSONE (DELTASONE) 20 MG tablet 3 tabs po day one, then 2 po daily x 4 days 12/28/15   Loren Racer, MD    Family History History reviewed. No pertinent family history.  Social History Social History  Substance Use Topics  . Smoking status: Former Games developer  . Smokeless tobacco: Never Used  . Alcohol use No     Comment: ocasional     Allergies   Patient has no known allergies.   Review of Systems Review of Systems  Neurological: Positive for headaches.  Psychiatric/Behavioral: Negative for suicidal ideas.  All other systems reviewed and are negative.    Physical Exam Updated Vital Signs BP 136/89 (BP Location: Right Arm)   Pulse 81   Temp 97.9 F (36.6 C) (Oral)   Resp 16   SpO2 95%   Physical  Exam  Constitutional: He appears well-developed and well-nourished. No distress.  HENT:  Head: Normocephalic and atraumatic.  Eyes: Conjunctivae are normal.  Cardiovascular: Normal rate, regular rhythm and normal heart sounds.   Pulmonary/Chest: Effort normal and breath sounds normal. He has no wheezes. He has no rales.  Abdominal: Soft. He exhibits no distension. There is no tenderness.  Neurological: He is alert.  Skin: Skin is warm and dry.  Psychiatric: He has a normal mood and affect.  Nursing note and vitals reviewed.    ED Treatments / Results  DIAGNOSTIC STUDIES:  Oxygen Saturation is 95% on  RA, normal by my interpretation.    COORDINATION OF CARE:  11:53 PM Discussed treatment plan with pt at bedside and pt agreed to plan.   Labs (all labs ordered are listed, but only abnormal results are displayed) Labs Reviewed  COMPREHENSIVE METABOLIC PANEL - Abnormal; Notable for the following:       Result Value   Glucose, Bld 101 (*)    All other components within normal limits  CBC - Abnormal; Notable for the following:    WBC 10.6 (*)    All other components within normal limits  ETHANOL  SALICYLATE LEVEL  ACETAMINOPHEN LEVEL  RAPID URINE DRUG SCREEN, HOSP PERFORMED  LITHIUM LEVEL    EKG  EKG Interpretation None       Radiology No results found.  Procedures Procedures (including critical care time)  Medications Ordered in ED Medications - No data to display   Initial Impression / Assessment and Plan / ED Course  I have reviewed the triage vital signs and the nursing notes.  Pertinent labs & imaging results that were available during my care of the patient were reviewed by me and considered in my medical decision making (see chart for details).     Patient presents to the emergency department with friends who feel he needs evaluation for bipolar symptoms and suicidal ideation. The patient is here voluntarily.   He is medically cleared and evaluated by TTS who recommends am psych evaluation.   Final Clinical Impressions(s) / ED Diagnoses   Final diagnoses:  None   1. Bipolar disorder 2. SI  New Prescriptions New Prescriptions   No medications on file  I personally performed the services described in this documentation, which was scribed in my presence. The recorded information has been reviewed and is accurate.      Elpidio AnisShari Halley Shepheard, PA-C 09/06/16 [id number]    Linwood DibblesJon Knapp, MD 09/10/16 2213
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
## 2016-09-01 NOTE — ED Triage Notes (Signed)
Pt states that his friends encouraged him to get some help. He states that he is not emotionally stable. He denies any thoughts of self-harm. A&Ox4. Ambulatory.

## 2016-09-02 DIAGNOSIS — F3131 Bipolar disorder, current episode depressed, mild: Secondary | ICD-10-CM | POA: Diagnosis not present

## 2016-09-02 DIAGNOSIS — R45851 Suicidal ideations: Secondary | ICD-10-CM

## 2016-09-02 DIAGNOSIS — F902 Attention-deficit hyperactivity disorder, combined type: Secondary | ICD-10-CM

## 2016-09-02 DIAGNOSIS — Z79899 Other long term (current) drug therapy: Secondary | ICD-10-CM

## 2016-09-02 DIAGNOSIS — Z87891 Personal history of nicotine dependence: Secondary | ICD-10-CM | POA: Diagnosis not present

## 2016-09-02 DIAGNOSIS — F312 Bipolar disorder, current episode manic severe with psychotic features: Secondary | ICD-10-CM

## 2016-09-02 LAB — RAPID URINE DRUG SCREEN, HOSP PERFORMED
Amphetamines: NOT DETECTED
BARBITURATES: NOT DETECTED
Benzodiazepines: NOT DETECTED
COCAINE: NOT DETECTED
Opiates: NOT DETECTED
TETRAHYDROCANNABINOL: NOT DETECTED

## 2016-09-02 LAB — LITHIUM LEVEL: Lithium Lvl: <0.06 mmol/L — ABNORMAL LOW (ref 0.60–1.20)

## 2016-09-02 MED ORDER — AMANTADINE HCL 100 MG PO CAPS
100.0000 mg | ORAL_CAPSULE | Freq: Every day | ORAL | Status: DC
  Administered 2016-09-02: 100 mg via ORAL
  Filled 2016-09-02: qty 1

## 2016-09-02 MED ORDER — ACETAMINOPHEN 325 MG PO TABS
650.0000 mg | ORAL_TABLET | Freq: Once | ORAL | Status: AC
  Administered 2016-09-02: 650 mg via ORAL
  Filled 2016-09-02: qty 2

## 2016-09-02 MED ORDER — TRAZODONE HCL 100 MG PO TABS: 100.0000 mg | ORAL_TABLET | Freq: Every evening | ORAL | 0 refills | Status: DC | PRN

## 2016-09-02 MED ORDER — AMANTADINE HCL 100 MG PO CAPS: 100.0000 mg | ORAL_CAPSULE | Freq: Every day | ORAL | 0 refills | Status: AC

## 2016-09-02 MED ORDER — TRAZODONE HCL 100 MG PO TABS: 100.0000 mg | ORAL_TABLET | Freq: Every evening | ORAL | Status: DC | PRN

## 2016-09-02 MED ORDER — GABAPENTIN 300 MG PO CAPS
300.0000 mg | ORAL_CAPSULE | Freq: Two times a day (BID) | ORAL | Status: DC
  Administered 2016-09-02: 300 mg via ORAL
  Filled 2016-09-02: qty 1

## 2016-09-02 MED ORDER — ARIPIPRAZOLE 10 MG PO TABS
10.0000 mg | ORAL_TABLET | ORAL | Status: DC
  Administered 2016-09-02: 10 mg via ORAL
  Filled 2016-09-02: qty 1

## 2016-09-02 MED ORDER — ARIPIPRAZOLE 10 MG PO TABS: 10.0000 mg | ORAL_TABLET | ORAL | 0 refills | Status: AC

## 2016-09-02 MED ORDER — GABAPENTIN 300 MG PO CAPS: 300.0000 mg | ORAL_CAPSULE | Freq: Two times a day (BID) | ORAL | 0 refills | Status: DC

## 2016-09-02 NOTE — Consult Note (Signed)
[facility] Face-to-Face Psychiatry Consult   Reason for Consult: psychiatric evaluation Referring Physician:  EDP Patient Identification: Eric Logan MRN:  [id number] Principal Diagnosis: Bipolar affective disorder, manic, severe, with psychotic behavior ([place]) Diagnosis:   Patient Active Problem List   Diagnosis Date Noted  . Bipolar affective disorder, manic, severe, with psychotic behavior ([place]) [F31.2] 09/02/2016    Priority: High  . Attention deficit hyperactivity disorder (ADHD), combined type [F90.2] 09/02/2016    Priority: Medium  . History of oppositional defiant disorder [Z86.59] 12/22/2014  . Former smoker [E75.170] 02/17/2014  . SHOULDER PAIN, RIGHT [M25.519] 08/23/2009  . Bipolar disorder ([place]) [F31.9] 07/13/2008  . ADD [F98.8] 07/13/2008    Total Time spent with patient: 45 minutes  Subjective:   Eric Logan is a 31 y.o. male patient admitted with suicidal ideation and delusional thinking  HPI: Patient with history of ADHD and Bipolar disorder who presents to the hospital after he was encouraged by his friends to get help. Patient reports that " I freaked out yesterday, having a rough day I guess.'' He states that he held a gun to his head and threatened to shoot himself before handing the gun over to a friend. His friend took the gun to the PD.  Patient reports that he has been having delusional thinking, getting agitated easily, having violent,racing thoughts and suicidal ideations with plan to shoot himself. He denies current drugs and alcohol abuse but had his of attending [place] meeting for alcohol use disorder.  Past Psychiatric History: as above  Risk to Self: Is patient at risk for suicide?: yes. Risk to Others:   Prior Inpatient Therapy:   Prior Outpatient Therapy:    Past Medical History:  Past Medical History:  Diagnosis Date  . ADHD (attention deficit hyperactivity disorder)   . Bipolar 1 disorder [facility])     Past Surgical History:  Procedure  Laterality Date  . APPENDECTOMY     Family History: History reviewed. No pertinent family history. Family Psychiatric  History:  Social History:  History  Alcohol Use No    Comment: ocasional     History  Drug Use No    Social History   Social History  . Marital status: Single    Spouse name: N/A  . Number of children: N/A  . Years of education: N/A   Social History Main Topics  . Smoking status: Former Research scientist (life sciences)  . Smokeless tobacco: Never Used  . Alcohol use No     Comment: ocasional  . Drug use: No  . Sexual activity: Yes   Other Topics Concern  . None   Social History Narrative  . None   Additional Social History:    Allergies:  No Known Allergies  Labs:  Results for orders placed or performed during the hospital encounter of 09/01/16 (from the past 48 hour(s))  Comprehensive metabolic panel     Status: Abnormal   Collection Time: 09/01/16 11:02 PM  Result Value Ref Range   Sodium 139 135 - 145 mmol/L   Potassium 4.3 3.5 - 5.1 mmol/L   Chloride 107 101 - 111 mmol/L   CO2 25 22 - 32 mmol/L   Glucose, Bld 101 (H) 65 - 99 mg/dL   BUN 20 6 - 20 mg/dL   Creatinine, Ser 0.94 0.61 - 1.24 mg/dL   Calcium 9.6 8.9 - 10.3 mg/dL   Total Protein 7.4 6.5 - 8.1 g/dL   Albumin 4.5 3.5 - 5.0 g/dL   AST 27 15 - 41  U/L   ALT 20 17 - 63 U/L   Alkaline Phosphatase 43 38 - 126 U/L   Total Bilirubin 1.1 0.3 - 1.2 mg/dL   GFR calc non Af Amer >60 >60 mL/min   GFR calc Af Amer >60 >60 mL/min    Comment: (NOTE) The eGFR has been calculated using the CKD EPI equation. This calculation has not been validated in all clinical situations. eGFR's persistently <60 mL/min signify possible Chronic Kidney Disease.    Anion gap 7 5 - 15  Ethanol     Status: None   Collection Time: 09/01/16 11:02 PM  Result Value Ref Range   Alcohol, Ethyl (B) <5 <5 mg/dL    Comment:        LOWEST DETECTABLE LIMIT FOR SERUM ALCOHOL IS 5 mg/dL FOR MEDICAL PURPOSES ONLY   Salicylate level      Status: None   Collection Time: 09/01/16 11:02 PM  Result Value Ref Range   Salicylate Lvl <1.6 2.8 - 30.0 mg/dL  Acetaminophen level     Status: Abnormal   Collection Time: 09/01/16 11:02 PM  Result Value Ref Range   Acetaminophen (Tylenol), Serum <10 (L) 10 - 30 ug/mL    Comment:        THERAPEUTIC CONCENTRATIONS VARY SIGNIFICANTLY. A RANGE OF 10-30 ug/mL MAY BE AN EFFECTIVE CONCENTRATION FOR MANY PATIENTS. HOWEVER, SOME ARE BEST TREATED AT CONCENTRATIONS OUTSIDE THIS RANGE. ACETAMINOPHEN CONCENTRATIONS >150 ug/mL AT 4 HOURS AFTER INGESTION AND >50 ug/mL AT 12 HOURS AFTER INGESTION ARE OFTEN ASSOCIATED WITH TOXIC REACTIONS.   cbc     Status: Abnormal   Collection Time: 09/01/16 11:02 PM  Result Value Ref Range   WBC 10.6 (H) 4.0 - 10.5 K/uL   RBC 5.34 4.22 - 5.81 MIL/uL   Hemoglobin 15.5 13.0 - 17.0 g/dL   HCT 45.5 39.0 - 52.0 %   MCV 85.2 78.0 - 100.0 fL   MCH 29.0 26.0 - 34.0 pg   MCHC 34.1 30.0 - 36.0 g/dL   RDW 14.3 11.5 - 15.5 %   Platelets 363 150 - 400 K/uL  Lithium level     Status: Abnormal   Collection Time: 09/01/16 11:03 PM  Result Value Ref Range   Lithium Lvl <0.06 (L) 0.60 - 1.20 mmol/L    Current Facility-Administered Medications  Medication Dose Route Frequency Provider Last Rate Last Dose  . amantadine (SYMMETREL) capsule 100 mg  100 mg Oral Daily Makinze Jani, MD      . ARIPiprazole (ABILIFY) tablet 10 mg  10 mg Oral BH-q7a Chudney Scheffler, MD      . gabapentin (NEURONTIN) capsule 300 mg  300 mg Oral BID Mahum Betten, MD      . traZODone (DESYREL) tablet 100 mg  100 mg Oral QHS PRN Corena Pilgrim, MD       Current Outpatient Prescriptions  Medication Sig Dispense Refill  . amantadine (SYMMETREL) 100 MG capsule Take 100 mg by mouth daily.    . ARIPiprazole (ABILIFY) 10 MG tablet Take 10 mg by mouth every morning.     . methylphenidate 36 MG PO CR tablet Take 36 mg by mouth 2 (two) times daily.    . diphenhydrAMINE (BENADRYL) 25 MG tablet  Take 1 tablet (25 mg total) by mouth every 6 (six) hours. (Patient not taking: Reported on 09/02/2016) 20 tablet 0  . fluticasone (FLONASE) 50 MCG/ACT nasal spray Place 2 sprays into both nostrils daily. (Patient not taking: Reported on 12/28/2015) 16 g 6  .  ibuprofen (ADVIL,MOTRIN) 600 MG tablet Take 1 tablet (600 mg total) by mouth every 6 (six) hours as needed. (Patient not taking: Reported on 09/02/2016) 30 tablet 0  . predniSONE (DELTASONE) 20 MG tablet 3 tabs po day one, then 2 po daily x 4 days (Patient not taking: Reported on 09/02/2016) 11 tablet 0    Musculoskeletal: Strength & Muscle Tone: within normal limits Gait & Station: normal Patient leans: Right  Psychiatric Specialty Exam: Physical Exam  Psychiatric: His mood appears anxious. His affect is angry and labile. His speech is rapid and/or pressured. He is agitated and hyperactive. Thought content is delusional. Cognition and memory are normal. He expresses impulsivity. He expresses homicidal and suicidal ideation. He expresses homicidal plans.    Review of Systems  Constitutional: Negative.   Eyes: Negative.   Respiratory: Negative.   Cardiovascular: Negative.   Gastrointestinal: Negative.   Genitourinary: Negative.   Musculoskeletal: Negative.   Skin: Negative.   Neurological: Negative.   Endo/Heme/Allergies: Negative.   Psychiatric/Behavioral: Positive for suicidal ideas. The patient is nervous/anxious and has insomnia.     Blood pressure 113/60, pulse 79, temperature 98.8 F (37.1 C), temperature source Oral, resp. rate 18, SpO2 98 %.There is no height or weight on file to calculate BMI.  General Appearance: Casual  Eye Contact:  Fair  Speech:  Clear and Coherent and Pressured  Volume:  Increased  Mood:  Angry and Irritable  Affect:  Labile  Thought Process:  Coherent and Descriptions of Associations: Intact  Orientation:  Full (Time, Place, and Person)  Thought Content:  Delusions and Paranoid Ideation  Suicidal  Thoughts:  Yes.  with intent/plan  Homicidal Thoughts:  No  Memory:  Immediate;   Fair Recent;   Fair Remote;   Fair  Judgement:  Poor  Insight:  Shallow  Psychomotor Activity:  Increased and Restlessness  Concentration:  Concentration: Poor and Attention Span: Poor  Recall:  Good  Fund of Knowledge:  Good  Language:  Good  Akathisia:  No  Handed:  Right  AIMS (if indicated):     Assets:  Communication Skills  ADL's:  Intact  Cognition:  WNL  Sleep:   poor     Treatment Plan Summary: Daily contact with patient to assess and evaluate symptoms and progress in treatment and Medication management  Discontinue Concerta due to increased aggression/agitation Continue Abilify 10 mg daily for Bipolar disorder Continue Amantadine 100 mg daily for EPS prevention. Start Gabapentin 300 mg bid for aggression/agitation.  Disposition: Recommend psychiatric Inpatient admission when medically cleared.  Corena Pilgrim, MD 09/02/2016 10:27 AM

## 2016-09-02 NOTE — ED Notes (Signed)
Pt made aware urine specimen is needed.  

## 2016-09-02 NOTE — [facility] Suicide Risk Assessment (Signed)
Suicide Risk Assessment  Discharge Assessment   [facility]   Principal Problem: Bipolar affective disorder, manic, severe, with psychotic behavior [facility](HCC) Discharge Diagnoses:  Patient Active Problem List   Diagnosis Date Noted  . Bipolar affective disorder, manic, severe, with psychotic behavior ([facility]) [F31.2] 09/02/2016    Priority: High  . Attention deficit hyperactivity disorder (ADHD), combined type [F90.2] 09/02/2016    Priority: High  . History of oppositional defiant disorder [Z86.59] 12/22/2014  . Former smoker [Z61.096][Z87.891] 02/17/2014  . SHOULDER PAIN, RIGHT [M25.519] 08/23/2009  . Bipolar disorder ([facility]) [F31.9] 07/13/2008  . ADD [F98.8] 07/13/2008    Total Time spent with patient: 45 minutes  Musculoskeletal: Strength & Muscle Tone: within normal limits Gait & Station: normal Patient leans: N/A  Psychiatric Specialty Exam:   Blood pressure 113/60, pulse 79, temperature 98.8 F (37.1 C), temperature source Oral, resp. rate 18, SpO2 98 %.There is no height or weight on file to calculate BMI.  General Appearance: Casual  Eye Contact::  Good  Speech:  Normal Rate409  Volume:  Normal  Mood:  Anxious  Affect:  Congruent  Thought Process:  Coherent and Descriptions of Associations: Intact  Orientation:  Full (Time, Place, and Person)  Thought Content:  WDL and Logical  Suicidal Thoughts:  No  Homicidal Thoughts:  No  Memory:  Immediate;   Good Recent;   Good Remote;   Good  Judgement:  Fair  Insight:  Fair  Psychomotor Activity:  Normal  Concentration:  Good  Recall:  Good  Fund of Knowledge:Fair  Language: Good  Akathisia:  No  Handed:  Right  AIMS (if indicated):     Assets:  Housing Leisure Time Physical Health Resilience Social Support  Sleep:     Cognition: impaired, mild  ADL's:  WDL   Mental Status Per Nursing Assessment::   On Admission:   Suicidal ideations.  Today, he wanted something to "shut down my brain" as he  stopped drinking a month ago and feels anxious and unclear at times.  He has IDD and his mother wants to take him home and he wants to go home.  No suicidal/homicidal ideations, hallucinations, or withdrawal symptoms noted.  Medications provided with Rx for gabapentin and Trazodone.    Demographic Factors:  Male and Caucasian  Loss Factors: NA  Historical Factors: NA  Risk Reduction Factors:   Sense of responsibility to family, Positive social support and Positive therapeutic relationship  Continued Clinical Symptoms:  Anxiety, mild  Cognitive Features That Contribute To Risk:  None    Suicide Risk:  Minimal: No identifiable suicidal ideation.  Patients presenting with no risk factors but with morbid ruminations; may be classified as minimal risk based on the severity of the depressive symptoms    Plan Of Care/Follow-up recommendations:  Activity:  as tolerated Diet:  heart healthy diet  Nia Nathaniel, Catha NottinghamJAMISON, NP 09/02/2016, 12:37 PM

## 2016-09-02 NOTE — ED Notes (Signed)
Patient has not urinated all night . Patient has been sleeping in hallway bed since 2300. Patient states that he does not need to urinate at this time.

## 2016-09-05 DIAGNOSIS — F3181 Bipolar II disorder: Secondary | ICD-10-CM | POA: Diagnosis not present

## 2016-09-06 DIAGNOSIS — F902 Attention-deficit hyperactivity disorder, combined type: Secondary | ICD-10-CM | POA: Diagnosis not present

## 2016-09-06 DIAGNOSIS — F319 Bipolar disorder, unspecified: Secondary | ICD-10-CM | POA: Diagnosis not present

## 2016-09-11 DIAGNOSIS — F3181 Bipolar II disorder: Secondary | ICD-10-CM | POA: Diagnosis not present

## 2016-09-12 DIAGNOSIS — F3181 Bipolar II disorder: Secondary | ICD-10-CM | POA: Diagnosis not present

## 2016-09-25 DIAGNOSIS — F3181 Bipolar II disorder: Secondary | ICD-10-CM | POA: Diagnosis not present

## 2016-10-03 DIAGNOSIS — F3181 Bipolar II disorder: Secondary | ICD-10-CM | POA: Diagnosis not present

## 2016-10-17 DIAGNOSIS — F3181 Bipolar II disorder: Secondary | ICD-10-CM | POA: Diagnosis not present

## 2016-11-26 DIAGNOSIS — F3181 Bipolar II disorder: Secondary | ICD-10-CM | POA: Diagnosis not present

## 2016-12-24 DIAGNOSIS — F3181 Bipolar II disorder: Secondary | ICD-10-CM | POA: Diagnosis not present

## 2017-01-27 DIAGNOSIS — F3181 Bipolar II disorder: Secondary | ICD-10-CM | POA: Diagnosis not present

## 2017-02-20 DIAGNOSIS — F3181 Bipolar II disorder: Secondary | ICD-10-CM | POA: Diagnosis not present

## 2017-02-26 ENCOUNTER — Encounter: Payer: Self-pay | Admitting: Family Medicine

## 2017-02-26 ENCOUNTER — Ambulatory Visit (INDEPENDENT_AMBULATORY_CARE_PROVIDER_SITE_OTHER): Payer: Medicare Other | Admitting: Family Medicine

## 2017-02-26 DIAGNOSIS — Z283 Underimmunization status: Secondary | ICD-10-CM | POA: Diagnosis not present

## 2017-02-26 DIAGNOSIS — M25511 Pain in right shoulder: Secondary | ICD-10-CM

## 2017-02-26 DIAGNOSIS — Z23 Encounter for immunization: Secondary | ICD-10-CM | POA: Diagnosis not present

## 2017-02-26 DIAGNOSIS — F312 Bipolar disorder, current episode manic severe with psychotic features: Secondary | ICD-10-CM | POA: Diagnosis not present

## 2017-02-26 DIAGNOSIS — L708 Other acne: Secondary | ICD-10-CM | POA: Diagnosis not present

## 2017-02-26 DIAGNOSIS — Z2839 Other underimmunization status: Secondary | ICD-10-CM

## 2017-02-26 MED ORDER — ERYTHROMYCIN 2 % EX SOLN: CUTANEOUS | 12 refills | Status: AC

## 2017-02-26 NOTE — Patient Instructions (Addendum)
I am updatinvg your flu shot and MMR today. I am sending a copy of your immunizations home so your Ruthell Rummage can double check a couple for me.  For your back, I have sent in a rx for erythromycin topical ointemnt. Use it twice daily  Your back and shoulder issues would probably respond to some rehabilitation exercises for your rhomboid muscles, especially on the right. I think your lungs are fine. Great to see you!

## 2017-02-27 DIAGNOSIS — Z2839 Other underimmunization status: Secondary | ICD-10-CM | POA: Insufficient documentation

## 2017-02-27 DIAGNOSIS — L709 Acne, unspecified: Secondary | ICD-10-CM | POA: Insufficient documentation

## 2017-02-27 DIAGNOSIS — Z283 Underimmunization status: Secondary | ICD-10-CM | POA: Insufficient documentation

## 2017-02-27 NOTE — Assessment & Plan Note (Signed)
Erythromycin ointment/gel for the back

## 2017-02-27 NOTE — Assessment & Plan Note (Signed)
Reviewed his records and it is not clear that he never finished his polio series. He has had hepatitis B series. We will update his tetanus and flu shot today. I referred them to the health department as we do not have any adult polio vaccine. I would not recommend meningoccus s vaccine at this time nor would I recommend HPV in this 31 year old male. Apparent off a copy of his state immunization records and reviewed it with him and his guardian, Lanetta InchSally Moore.

## 2017-02-27 NOTE — Progress Notes (Signed)
    CHIEF COMPLAINT / HPI:   In after a long absence for a general checkup. He is on is now his official guardian. She reviewed his medical records and relies the head missed quite a few of his immunizations at some point. She wants him to get caught up on those. He does not know exactly which ones she was talking about. He is currently living in his own apartment. He continues to receive disability and is not currently working.   REVIEW OF SYSTEMS:  Review of Systems  Constitutional: Negative for activity chang; no  appetite change and no unexpected weight change.  Eyes: Negative for eye pain and no visual disturbance.  Neck: denies neck pain; no swallowing problems CV: No chest pain, no shortness of breath, no lower extremity edema. No change in exercise tolerance Respiratory: Negative for cough or wheezing.  No shortness of breath. Gastrointestinal: Negative for abdominal pain, no diarrhea and no  constipation.  Genitourinary: Negative for decreased urine volume and  no difficulty urinating.  Musculoskeletal: Negative for arthralgias. No muscle weakness. Skin: Negative for rash.  Psychiatric/Behavioral: Negative for behavioral problems; no sleep disturbance and no  agitation.     PERTINENT  PMH / PSH: I have reviewed the patient's medications, allergies, past medical and surgical history, smoking status and updated in the EMR as appropriate. Recent psychiatric hospitalization Attends Alcoholics Anonymous and Al-Anon meetings. Eric Logan is his guardian currently. He is estranged from his mother. He is sexually active with multiple partners. He denies alcohol, illicit drugs and tobacco currently.  OBJECTIVE:    Vital signs reviewed GENERALl: Well developed, well nourished, in no acute distress. HEENT: PERRLA, EOMI, sclerae are nonicteric NECK: Supple, FROM, without lymphadenopathy.  THYROID: normal without nodularity CAROTID ARTERIES: without bruits LUNGS: clear to  auscultation bilaterally. No wheezes or rales. Normal respiratory effort HEART: Regular rate and rhythm, no murmurs. Distal pulses are bilaterally symmetrical, 2+. ABDOMEN: soft with positive bowel sounds. No masses noted MSK: MOE x 4. Normal muscle strength, bulk and tone. SKIN no rash. Normal temperature. Some mild acne lesions on his back. NEURO: no focal deficits. Normal gait. Normal balance. PSYCHIATRIC: Alert and oriented 4. Affects interactive. He has mildly pressured speech. Asks and answers questions appropriately. Denies hallucination, no suicidal or homicidal ideation. Denies current feelings of anxiety.   ASSESSMENT / PLAN: I recommended he follow-up with me in about 6 months as he has complex medical history and tends to fill long periods of time without physician evaluation.

## 2017-02-27 NOTE — Assessment & Plan Note (Signed)
I reassured him about his shoulder. I would recommend some rhomboid muscle exercises.

## 2017-03-27 DIAGNOSIS — F3181 Bipolar II disorder: Secondary | ICD-10-CM | POA: Diagnosis not present

## 2017-04-30 ENCOUNTER — Encounter: Payer: Self-pay | Admitting: Student in an Organized Health Care Education/Training Program

## 2017-04-30 ENCOUNTER — Ambulatory Visit (INDEPENDENT_AMBULATORY_CARE_PROVIDER_SITE_OTHER): Payer: Medicare Other | Admitting: Student in an Organized Health Care Education/Training Program

## 2017-04-30 ENCOUNTER — Other Ambulatory Visit: Payer: Self-pay

## 2017-04-30 VITALS — BP 105/75 | HR 90 | Temp 98.6°F | Ht 68.0 in | Wt 181.6 lb

## 2017-04-30 DIAGNOSIS — J018 Other acute sinusitis: Secondary | ICD-10-CM | POA: Diagnosis present

## 2017-04-30 MED ORDER — AZITHROMYCIN 250 MG PO TABS: ORAL_TABLET | ORAL | 0 refills | Status: DC

## 2017-04-30 NOTE — Patient Instructions (Signed)
It was a pleasure seeing you today in our clinic. Today we discussed your sinus pain. Here is the treatment plan we have discussed and agreed upon together:  You can take the antibiotic that I prescribed. I would also recommend doing saline flushes to help remove the mucous. Otherwise, continue supportive care by staying hydrated and controlling your pain with over the counter medications such as ibuprofen.    Our clinic's number is [phone]. Please call with questions or concerns about what we discussed today.  Be well, Dr. Mosetta PuttFeng

## 2017-04-30 NOTE — Assessment & Plan Note (Signed)
May be 2/2 viral URI, however given that symptom of sinus pain has been worsening at days 7-8, will go ahead and treat with azithromycin x5d - return precautions provided, including if symptoms worsen or fail to improve over the next week - recommend saline nasal irrigation (netty pot) - can continue zyrtec he's been taking at home - tylenol or ibuprofen for sinus pain

## 2017-04-30 NOTE — Progress Notes (Signed)
   CC: Sinus pain  HPI: Cecile HearingRichard M Droge is a 31 y.o. male with PMH significant for ODD, bipolar, ADHD who [facility] with sinus pain and tenderness  of 8 days duration.   Sinus pain/URI Patient was in his usual state of health until 7-8 days ago when he developed right-sided sinus pain and pressure with associated cough and rhinorrhea. He reports that the pain is associated with congestion, and right eye tearing No fevers. No wheezing. No shortness of breath or chest pain. No nausea, vomiting, diarrhea, or constipation. No urinary symptoms such as dysuria or urinary frequency or urgency. No known sick contacts. Has been taking zyrtec.  Review of Symptoms:  See HPI for ROS.   CC, SH/smoking status, and VS noted.  Objective: BP 105/75 (BP Location: Left Arm, Patient Position: Sitting, Cuff Size: Normal)   Pulse 90   Temp 98.6 F (37 C) (Oral)   Ht 5\' 8"  (1.727 m)   Wt 181 lb 9.6 oz (82.4 kg)   SpO2 99%   BMI 27.61 kg/m  GEN: NAD, alert, cooperative, and pleasant. EYE: no conjunctival injection, pupils equally round and reactive to light ENMT: normal tympanic light reflex, no nasal polyps,no rhinorrhea, mildly pharyngeal erythema without exudates, +mild sinus tenderness noted on the right frontal sinus and maxillary sinus NECK: full ROM, no thyromegaly RESPIRATORY: clear to auscultation bilaterally with no wheezes, rhonchi or rales, good effort CV: RRR, no m/r/g, no peripheral edema GI: soft, non-tender, non-distended, no hepatosplenomegaly SKIN: warm and dry, no rashes or lesions   Assessment and plan:  Other acute sinusitis May be 2/2 viral URI, however given that symptom of sinus pain has been worsening at days 7-8, will go ahead and treat with azithromycin x5d - return precautions provided, including if symptoms worsen or fail to improve over the next week - recommend saline nasal irrigation (netty pot) - can continue zyrtec he's been taking at home - tylenol or  ibuprofen for sinus pain   Meds ordered this encounter  Medications  . azithromycin (ZITHROMAX) 250 MG tablet    Sig: Take 500 mg on day one. Take 250 mg on days 2-5.    Dispense:  6 tablet    Refill:  0    Howard PouchLauren Arrayah Connors, MD,MS,  PGY2 04/30/2017 2:40 PM

## 2017-05-29 DIAGNOSIS — F3181 Bipolar II disorder: Secondary | ICD-10-CM | POA: Diagnosis not present

## 2017-07-09 IMAGING — CT CT NECK W/ CM
4 of 5 series · 15 of 33 positions shown, 17 images · IV contrast (ISOVUE)
Comparison: None.

CLINICAL DATA: Worsening sore throat for 3 days, worse when supine.

EXAM:
CT NECK WITH CONTRAST
TECHNIQUE: Multidetector CT imaging of the neck was performed using the
standard protocol following the bolus administration of intravenous
contrast.
CONTRAST:  75mL Z8DHD9-199 IOPAMIDOL (Z8DHD9-199) INJECTION 61%

[Series 4: neck with st · axial · 0.43mm/px · z∈[-245,-121]mm · 4 of 104 slices shown, 5 images]
[im 21/104  soft-tissue]
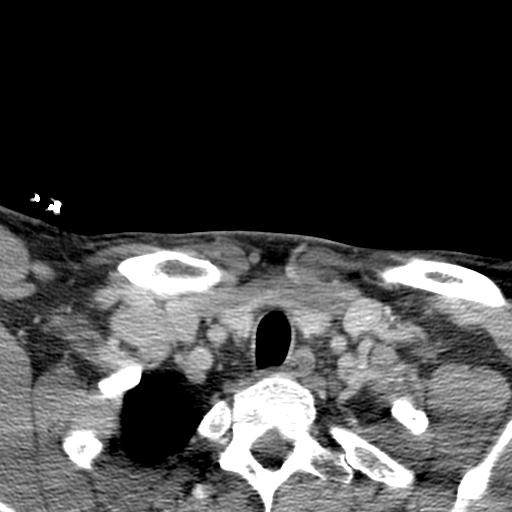
[im 21/104  bone]
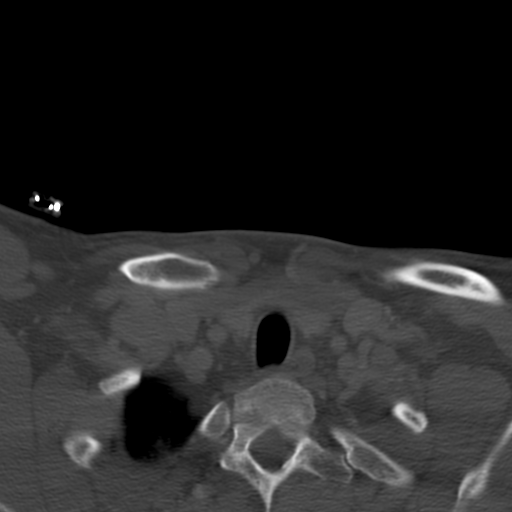
[im 42/104  bone]
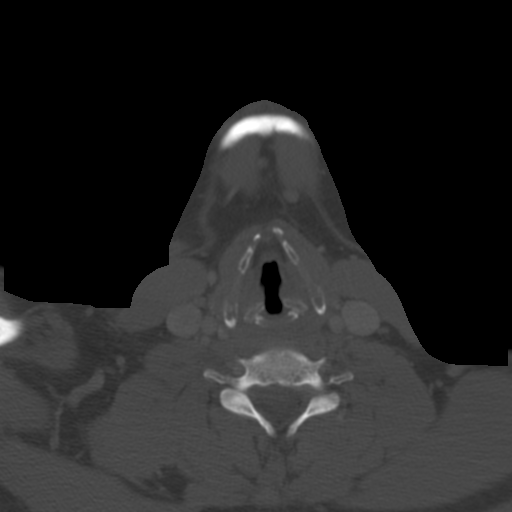
[im 62/104  bone]
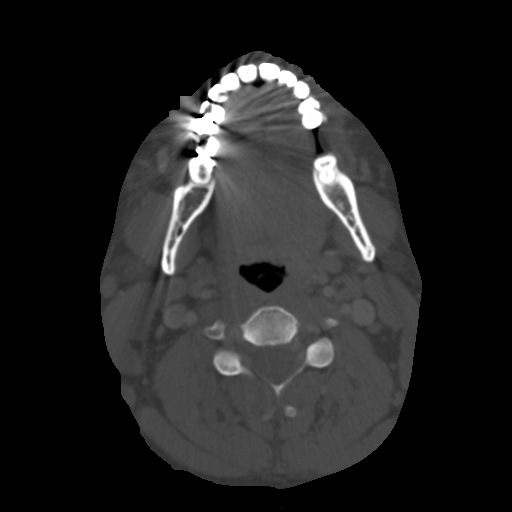
[im 83/104  bone]
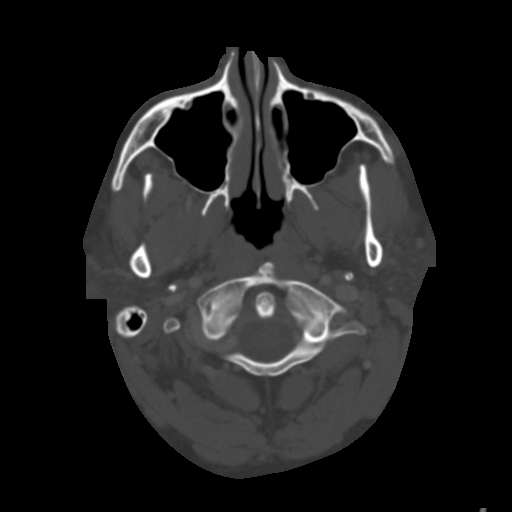

[Series 6: coronal st · coronal · 0.42mm/px · 3 of 92 slices shown]
[im 30/92  bone]
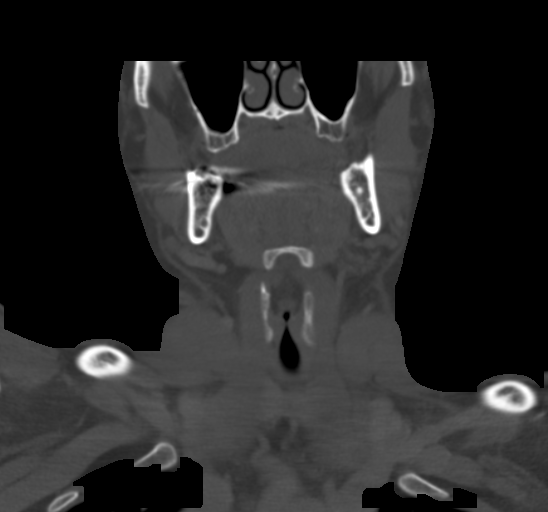
[im 41/92  bone]
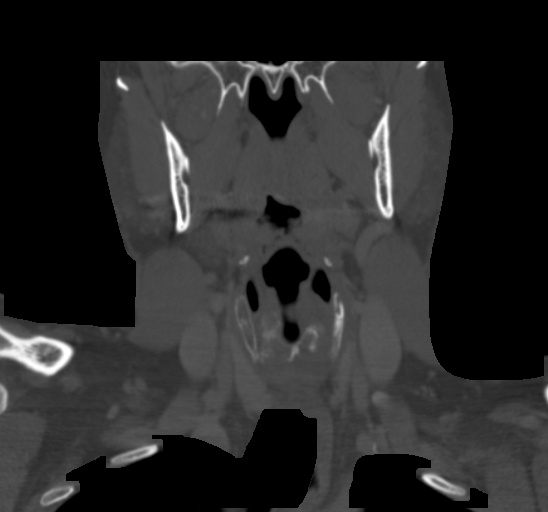
[im 52/92  bone]
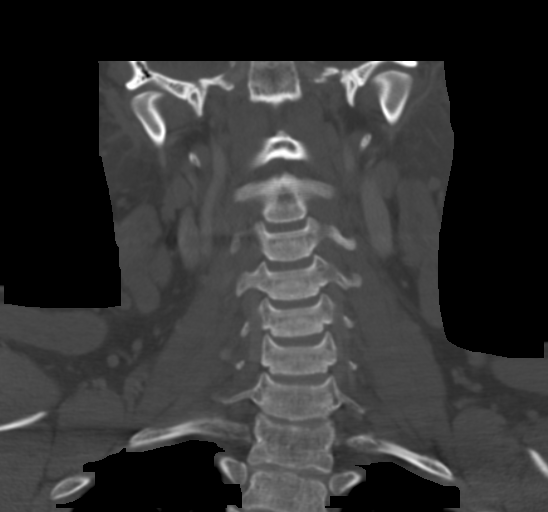

[Series 7: sagittal st · sagittal · 0.35mm/px · 5 of 101 slices shown, 6 images]
[im 34/101  bone]
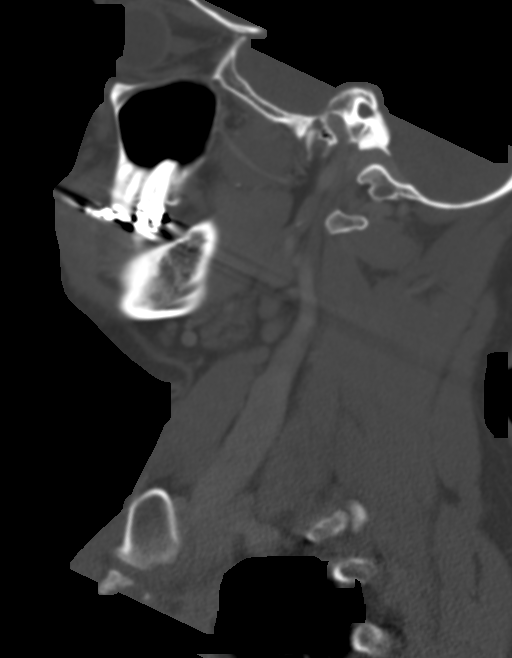
[im 42/101  bone]
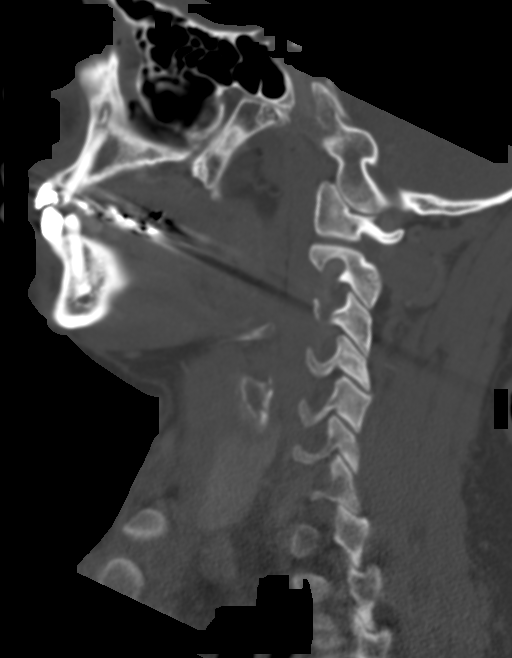
[im 51/101  soft-tissue]
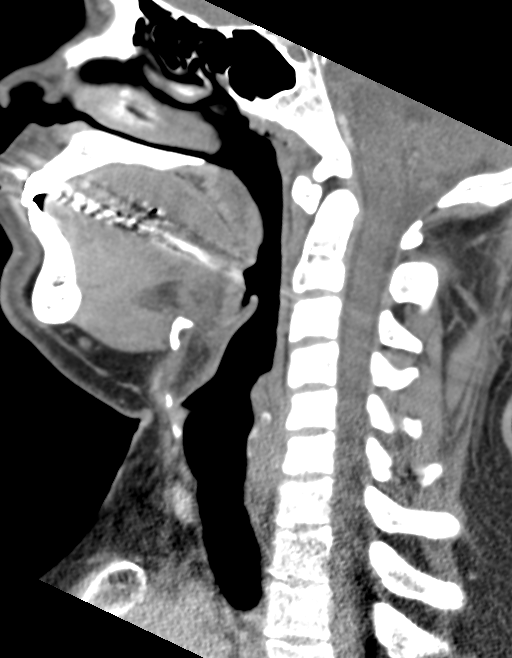
[im 51/101  bone]
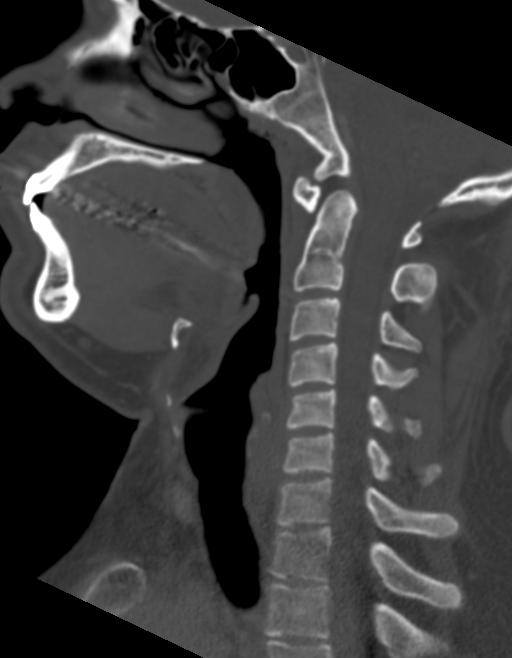
[im 59/101  bone]
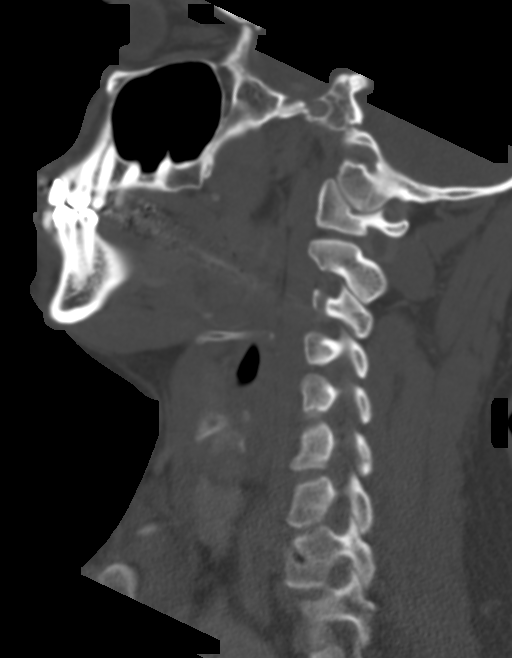
[im 67/101  bone]
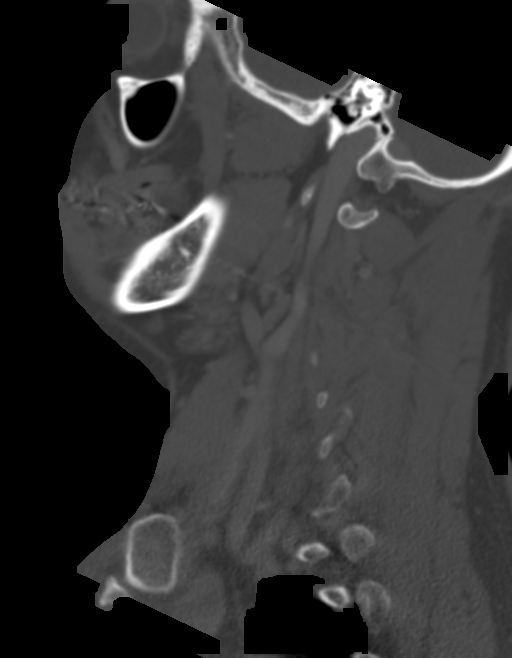

[Series 8: axial recons · axial · 0.34mm/px · z∈[-276,-202]mm · 3 of 113 slices shown]
[im 23/113  bone]
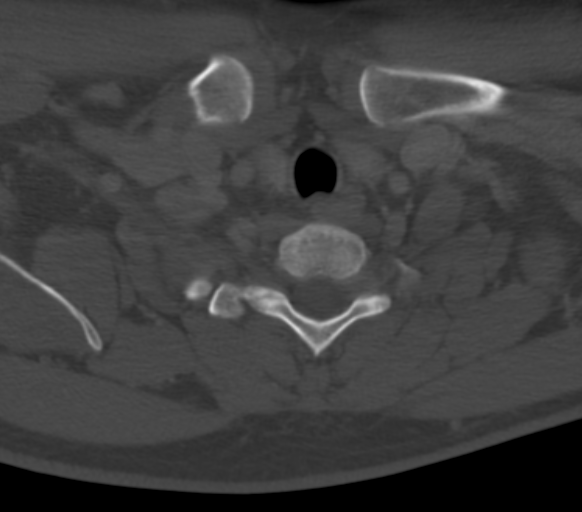
[im 45/113  bone]
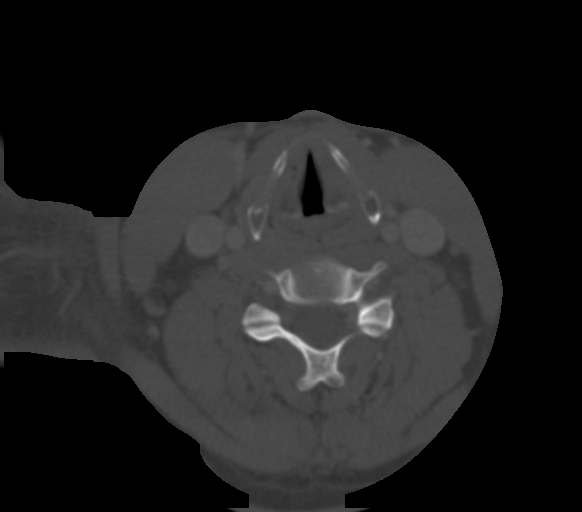
[im 68/113  bone]
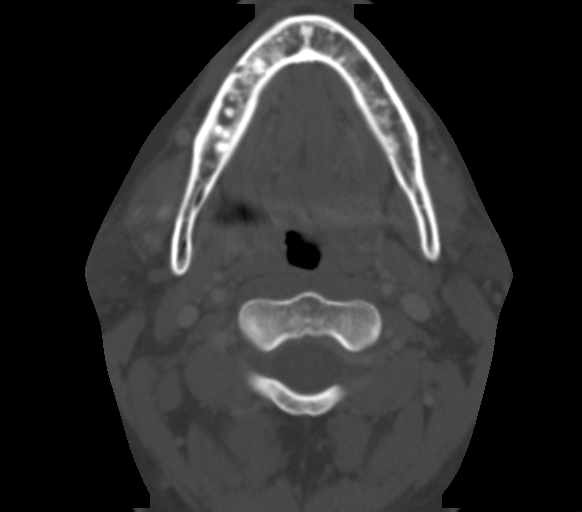

[15 of 33 positions shown; findings below may reference images not displayed]

FINDINGS: Pharynx and larynx: Normal appearance of the pharynx and larynx.
Streak artifact from dental amalgam limits assessment of tongue.

Salivary glands: Less than 1 cm bilateral parotid lymph nodes.
Mildly atrophic submandibular glands.

Thyroid: Normal.

Lymph nodes: No lymphadenopathy by CT size criteria.

Vascular: Normal.

Limited intracranial: Normal. LEFT vertebral artery likely
terminates in the posterior inferior cerebellar artery.

Visualized orbits: Normal.

Mastoids and visualized paranasal sinuses: Well-aerated.

Skeleton: Normal. Mild broad cervical thoracic levoscoliosis could
be positional.

Upper chest: Lung apices are clear.
IMPRESSION: Negative CT neck with contrast.

## 2017-07-11 DIAGNOSIS — Z0000 Encounter for general adult medical examination without abnormal findings: Secondary | ICD-10-CM | POA: Diagnosis not present

## 2017-07-11 DIAGNOSIS — R5383 Other fatigue: Secondary | ICD-10-CM | POA: Diagnosis not present

## 2017-07-11 DIAGNOSIS — E559 Vitamin D deficiency, unspecified: Secondary | ICD-10-CM | POA: Diagnosis not present

## 2017-07-11 DIAGNOSIS — M25511 Pain in right shoulder: Secondary | ICD-10-CM | POA: Diagnosis not present

## 2017-07-11 DIAGNOSIS — Z131 Encounter for screening for diabetes mellitus: Secondary | ICD-10-CM | POA: Diagnosis not present

## 2017-07-11 DIAGNOSIS — G8929 Other chronic pain: Secondary | ICD-10-CM | POA: Diagnosis not present

## 2017-07-11 DIAGNOSIS — E7800 Pure hypercholesterolemia, unspecified: Secondary | ICD-10-CM | POA: Diagnosis not present

## 2017-08-14 DIAGNOSIS — E7800 Pure hypercholesterolemia, unspecified: Secondary | ICD-10-CM | POA: Diagnosis not present

## 2017-08-14 DIAGNOSIS — E559 Vitamin D deficiency, unspecified: Secondary | ICD-10-CM | POA: Diagnosis not present

## 2017-08-14 DIAGNOSIS — L0211 Cutaneous abscess of neck: Secondary | ICD-10-CM | POA: Diagnosis not present

## 2017-08-14 DIAGNOSIS — L03221 Cellulitis of neck: Secondary | ICD-10-CM | POA: Diagnosis not present

## 2017-08-28 DIAGNOSIS — F3181 Bipolar II disorder: Secondary | ICD-10-CM | POA: Diagnosis not present

## 2017-11-27 DIAGNOSIS — F3181 Bipolar II disorder: Secondary | ICD-10-CM | POA: Diagnosis not present

## 2017-12-01 DIAGNOSIS — F3181 Bipolar II disorder: Secondary | ICD-10-CM | POA: Diagnosis not present

## 2017-12-23 ENCOUNTER — Emergency Department (HOSPITAL_COMMUNITY): Payer: Medicare Other

## 2017-12-23 ENCOUNTER — Emergency Department (HOSPITAL_COMMUNITY)
Admission: EM | Admit: 2017-12-23 | Discharge: 2017-12-23 | Disposition: A | Discharge status: Home / Self Care | Payer: Medicare Other | Attending: Emergency Medicine | Admitting: Emergency Medicine

## 2017-12-23 ENCOUNTER — Encounter (HOSPITAL_COMMUNITY): Payer: Self-pay | Admitting: Emergency Medicine

## 2017-12-23 ENCOUNTER — Other Ambulatory Visit: Payer: Self-pay

## 2017-12-23 DIAGNOSIS — S82831A Other fracture of upper and lower end of right fibula, initial encounter for closed fracture: Secondary | ICD-10-CM | POA: Diagnosis not present

## 2017-12-23 DIAGNOSIS — S8264XA Nondisplaced fracture of lateral malleolus of right fibula, initial encounter for closed fracture: Secondary | ICD-10-CM | POA: Diagnosis not present

## 2017-12-23 DIAGNOSIS — M79671 Pain in right foot: Secondary | ICD-10-CM | POA: Diagnosis not present

## 2017-12-23 DIAGNOSIS — Y9355 Activity, bike riding: Secondary | ICD-10-CM | POA: Diagnosis not present

## 2017-12-23 DIAGNOSIS — M79661 Pain in right lower leg: Secondary | ICD-10-CM | POA: Diagnosis not present

## 2017-12-23 DIAGNOSIS — Y999 Unspecified external cause status: Secondary | ICD-10-CM | POA: Diagnosis not present

## 2017-12-23 DIAGNOSIS — Z79899 Other long term (current) drug therapy: Secondary | ICD-10-CM | POA: Insufficient documentation

## 2017-12-23 DIAGNOSIS — S8991XA Unspecified injury of right lower leg, initial encounter: Secondary | ICD-10-CM | POA: Diagnosis present

## 2017-12-23 DIAGNOSIS — M25571 Pain in right ankle and joints of right foot: Secondary | ICD-10-CM | POA: Diagnosis not present

## 2017-12-23 DIAGNOSIS — Y9289 Other specified places as the place of occurrence of the external cause: Secondary | ICD-10-CM | POA: Diagnosis not present

## 2017-12-23 DIAGNOSIS — Z87891 Personal history of nicotine dependence: Secondary | ICD-10-CM | POA: Insufficient documentation

## 2017-12-23 DIAGNOSIS — S99921A Unspecified injury of right foot, initial encounter: Secondary | ICD-10-CM | POA: Diagnosis not present

## 2017-12-23 MED ORDER — IBUPROFEN 600 MG PO TABS: 600.0000 mg | ORAL_TABLET | Freq: Four times a day (QID) | ORAL | 0 refills | Status: AC | PRN

## 2017-12-23 MED ORDER — IBUPROFEN 200 MG PO TABS
600.0000 mg | ORAL_TABLET | Freq: Once | ORAL | Status: AC
  Administered 2017-12-23: 600 mg via ORAL
  Filled 2017-12-23: qty 3

## 2017-12-23 MED ORDER — HYDROCODONE-ACETAMINOPHEN 5-325 MG PO TABS: 1.0000 | ORAL_TABLET | ORAL | 0 refills | Status: AC | PRN
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
## 2017-12-23 NOTE — Discharge Instructions (Signed)
Rest - please stay off right leg as much as possible Ice - ice for 20 minutes at a time, several times a day Elevate - elevate right leg above level of heart Ibuprofen - take with food. Take up to 3-4 times daily Take Norco for severe pain

## 2017-12-29 ENCOUNTER — Encounter (INDEPENDENT_AMBULATORY_CARE_PROVIDER_SITE_OTHER): Payer: Self-pay | Admitting: Physician Assistant

## 2017-12-29 ENCOUNTER — Ambulatory Visit (INDEPENDENT_AMBULATORY_CARE_PROVIDER_SITE_OTHER): Payer: Medicare Other | Admitting: Physician Assistant

## 2017-12-29 DIAGNOSIS — S82831A Other fracture of upper and lower end of right fibula, initial encounter for closed fracture: Secondary | ICD-10-CM
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
## 2018-01-01 DIAGNOSIS — F3181 Bipolar II disorder: Secondary | ICD-10-CM | POA: Diagnosis not present

## 2018-01-19 ENCOUNTER — Ambulatory Visit (INDEPENDENT_AMBULATORY_CARE_PROVIDER_SITE_OTHER): Payer: Medicare Other | Admitting: Physician Assistant

## 2018-01-19 ENCOUNTER — Ambulatory Visit (INDEPENDENT_AMBULATORY_CARE_PROVIDER_SITE_OTHER): Payer: Medicare Other

## 2018-01-19 ENCOUNTER — Encounter (INDEPENDENT_AMBULATORY_CARE_PROVIDER_SITE_OTHER): Payer: Self-pay | Admitting: Physician Assistant

## 2018-01-19 DIAGNOSIS — S82831D Other fracture of upper and lower end of right fibula, subsequent encounter for closed fracture with routine healing: Secondary | ICD-10-CM

## 2018-01-19 NOTE — Progress Notes (Signed)
Office Visit Note   Patient: Eric Logan           Date of Birth: 04/19/1986           MRN: [id number] Visit Date: 01/19/2018              Requested by: Augustin SchoolingSun, Lisa L, MD [address] [place], [place] [address] PCP: Salli RealSun, Yun, MD   Assessment & Plan: Visit Diagnoses:  1. Closed fracture of proximal end of right fibula with routine healing, unspecified fracture morphology, subsequent encounter     Plan: He can come out of the knee brace.  No high impact activities or sports.  He will follow-up with us in 1 month AP and lateral views of the right knee at the time.  He is to be mindful of mechanical symptoms of the knee that are reviewed with he and his aunt who is present today.  Follow-Up Instructions: Return in about 1 month (around 02/16/2018).   Orders:  Orders Placed This Encounter  Procedures  . XR Knee 1-2 Views Right   No orders of the defined types were placed in this encounter.     Procedures: No procedures performed   Clinical Data: No additional findings.   Subjective: Chief Complaint  Patient presents with  . Right Leg - Follow-up    HPI Eric Logan returns 4 weeks status post  right proximal fibula fracture.  He has had some popping in the knee when he first ambulates in the morning.  Otherwise he has had no mechanical symptoms of the knee.  States he is taking no pain medication.  Overall trending towards.  He continues to wear the hinged knee brace. Review of Systems Please see HPI otherwise negative  Objective: Vital Signs: There were no vitals taken for this visit.  Physical Exam  Constitutional: He is oriented to person, place, and time. He appears well-developed and well-nourished. No distress.  Neurological: He is alert and oriented to person, place, and time.  Skin: He is not diaphoretic.    Ortho Exam Right knee good range of motion.  No effusion.  Minimal tenderness over the proximal fibula.  Right calf supple  Specialty Comments:  No  specialty comments available.  Imaging: Xr Knee 1-2 Views Right  Result Date: 01/19/2018 AP and lateral views right knee: Interval healing of the proximal fibula fracture.  No change in overall position alignment.  Knee joint otherwise well-maintained.  No dislocation subluxation.    PMFS History: Patient Active Problem List   Diagnosis Date Noted  . Other acute sinusitis 04/30/2017  . Immunization deficiency 02/27/2017  . Acne 02/27/2017  . Bipolar affective disorder, manic, severe, with psychotic behavior (HCC) 09/02/2016  . Attention deficit hyperactivity disorder (ADHD), combined type 09/02/2016  . History of oppositional defiant disorder 12/22/2014  . Former smoker 02/17/2014  . SHOULDER PAIN, RIGHT 08/23/2009  . ADD 07/13/2008   Past Medical History:  Diagnosis Date  . ADHD (attention deficit hyperactivity disorder)   . Bipolar 1 disorder (HCC)     No family history on file.  Past Surgical History:  Procedure Laterality Date  . APPENDECTOMY    . WISDOM TOOTH EXTRACTION     Social History   Occupational History  . Not on file  Tobacco Use  . Smoking status: Former Games developermoker  . Smokeless tobacco: Never Used  Substance and Sexual Activity  . Alcohol use: No    Comment: ocasional  . Drug use: No  . Sexual activity:  Yes

## 2018-02-09 DIAGNOSIS — H5213 Myopia, bilateral: Secondary | ICD-10-CM | POA: Diagnosis not present

## 2018-02-09 DIAGNOSIS — H52223 Regular astigmatism, bilateral: Secondary | ICD-10-CM | POA: Diagnosis not present

## 2018-02-09 DIAGNOSIS — H538 Other visual disturbances: Secondary | ICD-10-CM | POA: Diagnosis not present

## 2018-02-17 ENCOUNTER — Ambulatory Visit (INDEPENDENT_AMBULATORY_CARE_PROVIDER_SITE_OTHER): Payer: Medicare Other | Admitting: Orthopaedic Surgery

## 2018-03-26 DIAGNOSIS — F3181 Bipolar II disorder: Secondary | ICD-10-CM | POA: Diagnosis not present

## 2018-04-09 DIAGNOSIS — Z23 Encounter for immunization: Secondary | ICD-10-CM | POA: Diagnosis not present

## 2018-04-23 DIAGNOSIS — F3181 Bipolar II disorder: Secondary | ICD-10-CM | POA: Diagnosis not present

## 2018-04-24 DIAGNOSIS — F3181 Bipolar II disorder: Secondary | ICD-10-CM | POA: Diagnosis not present

## 2018-12-13 ENCOUNTER — Other Ambulatory Visit: Payer: Self-pay

## 2018-12-13 ENCOUNTER — Emergency Department (HOSPITAL_COMMUNITY)
Admission: EM | Admit: 2018-12-13 | Discharge: 2018-12-13 | Disposition: A | Discharge status: Home / Self Care | Payer: Medicare Other | Attending: Emergency Medicine | Admitting: Emergency Medicine

## 2018-12-13 ENCOUNTER — Encounter (HOSPITAL_COMMUNITY): Payer: Self-pay

## 2018-12-13 DIAGNOSIS — F902 Attention-deficit hyperactivity disorder, combined type: Secondary | ICD-10-CM | POA: Diagnosis not present

## 2018-12-13 DIAGNOSIS — W01198A Fall on same level from slipping, tripping and stumbling with subsequent striking against other object, initial encounter: Secondary | ICD-10-CM | POA: Diagnosis not present

## 2018-12-13 DIAGNOSIS — Y93K1 Activity, walking an animal: Secondary | ICD-10-CM | POA: Insufficient documentation

## 2018-12-13 DIAGNOSIS — Z79899 Other long term (current) drug therapy: Secondary | ICD-10-CM | POA: Diagnosis not present

## 2018-12-13 DIAGNOSIS — Z87891 Personal history of nicotine dependence: Secondary | ICD-10-CM | POA: Insufficient documentation

## 2018-12-13 DIAGNOSIS — Y999 Unspecified external cause status: Secondary | ICD-10-CM | POA: Diagnosis not present

## 2018-12-13 DIAGNOSIS — Y9289 Other specified places as the place of occurrence of the external cause: Secondary | ICD-10-CM | POA: Insufficient documentation

## 2018-12-13 DIAGNOSIS — S0993XA Unspecified injury of face, initial encounter: Secondary | ICD-10-CM

## 2018-12-13 DIAGNOSIS — S025XXA Fracture of tooth (traumatic), initial encounter for closed fracture: Secondary | ICD-10-CM | POA: Insufficient documentation

## 2018-12-13 NOTE — ED Provider Notes (Signed)
[facility]-EMERGENCY DEPT Provider Note   CSN: [id number] Arrival date & time: 12/13/18  2145    History   Chief Complaint Chief Complaint  Patient presents with  . Dental Injury    HPI Eric Logan is a 33 y.o. male.     33 year old male with a history of bipolar 1 disorder and ADHD presents to the ED for evaluation of dental injury.  Patient states that he was walking his dog when his dog pulled on the leash and he slipped falling on asphalt.  Fell face forward against the curb breaking multiple teeth.  He had some swelling initially with limited jaw movement secondary to pain.  States this has resolved.  Denies issues with jaw opening at present.  Did take some ibuprofen and use Orajel prior to arrival.  No hx of LOC, difficulty breathing or swallowing.  He does have a dentist.  The history is provided by the patient. No language interpreter was used.  Dental Injury    Past Medical History:  Diagnosis Date  . ADHD (attention deficit hyperactivity disorder)   . Bipolar 1 disorder [facility](HCC)     Patient Active Problem List   Diagnosis Date Noted  . Other acute sinusitis 04/30/2017  . Immunization deficiency 02/27/2017  . Acne 02/27/2017  . Bipolar affective disorder, manic, severe, with psychotic behavior (HCC) 09/02/2016  . Attention deficit hyperactivity disorder (ADHD), combined type 09/02/2016  . History of oppositional defiant disorder 12/22/2014  . Former smoker 02/17/2014  . SHOULDER PAIN, RIGHT 08/23/2009  . ADD 07/13/2008    Past Surgical History:  Procedure Laterality Date  . APPENDECTOMY    . WISDOM TOOTH EXTRACTION          Home Medications    Prior to Admission medications   Medication Sig Start Date End Date Taking? Authorizing Provider  amantadine (SYMMETREL) 100 MG capsule Take 1 capsule (100 mg total) by mouth daily. 09/02/16   Charm RingsLord, Jamison Y, NP  ARIPiprazole (ABILIFY) 10 MG tablet Take 1 tablet (10 mg total) by  mouth every morning. Patient not taking: Reported on 12/23/2017 09/02/16   Charm RingsLord, Jamison Y, NP  ARIPiprazole (ABILIFY) 20 MG tablet Take 20 mg by mouth daily.    [provider]  atomoxetine (STRATTERA) 80 MG capsule Take 80 mg by mouth daily.    [provider]  erythromycin with ethanol (THERAMYCIN) 2 % external solution Apply to back twice daily for acne 02/26/17   Nestor RampNeal, Sara L, MD  HYDROcodone-acetaminophen (NORCO/VICODIN) 5-325 MG tablet Take 1 tablet by mouth every 4 (four) hours as needed. 12/23/17   Bethel BornGekas, Brandis Wixted Marie, PA-C  ibuprofen (ADVIL,MOTRIN) 600 MG tablet Take 1 tablet (600 mg total) by mouth every 6 (six) hours as needed. 12/23/17   Bethel BornGekas, [place] Wachter Marie, PA-C    Family History No family history on file.  Social History Social History   Tobacco Use  . Smoking status: Former Games developermoker  . Smokeless tobacco: Never Used  Substance Use Topics  . Alcohol use: No    Comment: ocasional  . Drug use: No     Allergies   Patient has no known allergies.   Review of Systems Review of Systems Ten systems reviewed and are negative for acute change, except as noted in the HPI.    Physical Exam Updated Vital Signs BP 125/77 (BP Location: Right Arm)   Pulse 73   Resp 18   Ht 5\' 9"  (1.753 m)   Wt 94.2 kg  SpO2 95%   BMI 30.66 kg/m   Physical Exam Vitals signs and nursing note reviewed.  Constitutional:      General: He is not in acute distress.    Appearance: He is well-developed. He is not diaphoretic.     Comments: Nontoxic appearing and in NAD  HENT:     Head: Normocephalic and atraumatic.     Mouth/Throat:     Mouth: Mucous membranes are moist.     Dentition: Abnormal dentition. Dental tenderness (mild) present.     Comments: Dental fracture to the right upper central incisor as well as the left upper canine. Loose left upper central and lateral incisor. No trismus. Tolerating secretions without difficulty. Eyes:     General: No scleral icterus.     Conjunctiva/sclera: Conjunctivae normal.  Neck:     Musculoskeletal: Normal range of motion.  Pulmonary:     Effort: Pulmonary effort is normal. No respiratory distress.     Comments: Respirations even and unlabored Musculoskeletal: Normal range of motion.  Skin:    General: Skin is warm and dry.     Coloration: Skin is not pale.     Findings: No erythema or rash.  Neurological:     Mental Status: He is alert and oriented to person, place, and time.  Psychiatric:        Behavior: Behavior normal.          ED Treatments / Results  Labs (all labs ordered are listed, but only abnormal results are displayed) Labs Reviewed - No data to display  EKG None  Radiology No results found.  Procedures Procedures (including critical care time)  Medications Ordered in ED Medications - No data to display   Initial Impression / Assessment and Plan / ED Course  I have reviewed the triage vital signs and the nursing notes.  Pertinent labs & imaging results that were available during my care of the patient were reviewed by me and considered in my medical decision making (see chart for details).        33 year old male resenting with dental fracture secondary to fall.  Pain managed well with ibuprofen and Orajel.  His fractured tooth was placed in milk.  Roots of both fractured teeth remain intact.  There is evidence of mild gingival irritation.  No trismus.  He has a Pharmacist, community he has been advised to reach out to tomorrow.   Final Clinical Impressions(s) / ED Diagnoses   Final diagnoses:  Dental injury, initial encounter    ED Discharge Orders    None       Antonietta Breach, PA-C 12/13/18 [place], [place], MD 12/14/18 [phone]

## 2018-12-13 NOTE — Discharge Instructions (Signed)
We recommend continued use of ibuprofen and Tylenol for management of pain.  Follow-up with your dentist as soon as possible for further management of your dental injury.  You may return to the ED for any new or concerning symptoms.

## 2018-12-13 NOTE — ED Notes (Signed)
Pt is requesting a referral to oral surgeon so he can leave

## 2018-12-13 NOTE — ED Triage Notes (Signed)
Pt arrived with a dental injury after falling today when walking the dog, pt reports hitting his mouth against the curb breaking multiple front teeth. Some swelling noted. Pt used oragel and took an ibuprofen for pain prior to arrival.

## 2019-09-24 ENCOUNTER — Ambulatory Visit: Payer: Medicare Other | Attending: Internal Medicine

## 2019-09-24 DIAGNOSIS — Z23 Encounter for immunization: Secondary | ICD-10-CM

## 2019-09-24 NOTE — Progress Notes (Signed)
   Covid-19 Vaccination Clinic  Name:  Eric Logan    MRN: [id number] DOB: 11-Dec-1985  09/24/2019  Mr. Eric Logan was observed post Covid-19 immunization for 15 minutes without incident. He was provided with Vaccine Information Sheet and instruction to access the V-Safe system.   Mr. Eric Logan was instructed to call 911 with any severe reactions post vaccine: Marland Kitchen Difficulty breathing  . Swelling of face and throat  . A fast heartbeat  . A bad rash all over body  . Dizziness and weakness   Immunizations Administered    Name Date Dose VIS Date Route   Pfizer COVID-19 Vaccine 09/24/2019  3:03 PM 0.3 mL 05/28/2019 Intramuscular   Manufacturer: [employer], [employer]   Lot: [id number]   NDC: [address]-1

## 2019-10-18 ENCOUNTER — Ambulatory Visit: Payer: Medicare Other | Attending: Internal Medicine

## 2019-10-18 DIAGNOSIS — Z23 Encounter for immunization: Secondary | ICD-10-CM

## 2019-10-18 NOTE — Progress Notes (Signed)
   Covid-19 Vaccination Clinic  Name:  Eric Logan    MRN: [id number] DOB: 03-Sep-1985  10/18/2019  [name] was observed post Covid-19 immunization for 15 minutes without incident. He was provided with Vaccine Information Sheet and instruction to access the V-Safe system.   [name] was instructed to call 911 with any severe reactions post vaccine: Marland Kitchen Difficulty breathing  . Swelling of face and throat  . A fast heartbeat  . A bad rash all over body  . Dizziness and weakness   Immunizations Administered    Name Date Dose VIS Date Route   Pfizer COVID-19 Vaccine 10/18/2019 11:27 AM 0.3 mL 08/11/2018 Intramuscular   Manufacturer: [employer]   Lot: [id number]   NDC: [address]-2

## 2020-04-29 ENCOUNTER — Encounter (HOSPITAL_COMMUNITY): Payer: Self-pay | Admitting: Emergency Medicine

## 2020-04-29 ENCOUNTER — Emergency Department (HOSPITAL_COMMUNITY)
Admission: EM | Admit: 2020-04-29 | Discharge: 2020-04-29 | Disposition: A | Discharge status: Home / Self Care | Payer: Medicare Other | Attending: Emergency Medicine | Admitting: Emergency Medicine

## 2020-04-29 ENCOUNTER — Other Ambulatory Visit: Payer: Self-pay

## 2020-04-29 DIAGNOSIS — Y93E5 Activity, floor mopping and cleaning: Secondary | ICD-10-CM | POA: Diagnosis not present

## 2020-04-29 DIAGNOSIS — Z23 Encounter for immunization: Secondary | ICD-10-CM | POA: Insufficient documentation

## 2020-04-29 DIAGNOSIS — S61422A Laceration with foreign body of left hand, initial encounter: Secondary | ICD-10-CM | POA: Diagnosis not present

## 2020-04-29 DIAGNOSIS — Z87891 Personal history of nicotine dependence: Secondary | ICD-10-CM | POA: Insufficient documentation

## 2020-04-29 DIAGNOSIS — S60922A Unspecified superficial injury of left hand, initial encounter: Secondary | ICD-10-CM | POA: Diagnosis present

## 2020-04-29 DIAGNOSIS — S61412A Laceration without foreign body of left hand, initial encounter: Secondary | ICD-10-CM

## 2020-04-29 MED ORDER — LIDOCAINE HCL (PF) 1 % IJ SOLN
INTRAMUSCULAR | Status: AC
  Administered 2020-04-29: 30 mL
  Filled 2020-04-29: qty 30

## 2020-04-29 MED ORDER — CEPHALEXIN 500 MG PO CAPS: 500.0000 mg | ORAL_CAPSULE | Freq: Four times a day (QID) | ORAL | 0 refills | Status: DC

## 2020-04-29 MED ORDER — TETANUS-DIPHTH-ACELL PERTUSSIS 5-2.5-18.5 LF-MCG/0.5 IM SUSY
0.5000 mL | PREFILLED_SYRINGE | Freq: Once | INTRAMUSCULAR | Status: AC
  Administered 2020-04-29: 0.5 mL via INTRAMUSCULAR
  Filled 2020-04-29: qty 0.5

## 2020-04-29 MED ORDER — LIDOCAINE HCL (PF) 1 % IJ SOLN: 30.0000 mL | Freq: Once | INTRAMUSCULAR | Status: AC
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
## 2020-04-29 NOTE — Discharge Instructions (Addendum)
WOUND CARE Please return in 7 days to have your stitches/staples removed or sooner if you have concerns.  Keep area clean and dry for 24 hours. Do not remove bandage, if applied.  After 24 hours, remove bandage and wash wound gently with mild soap and warm water. Reapply a new bandage after cleaning wound, if directed.  Continue daily cleansing with soap and water until stitches/staples are removed.  Do not apply any ointments or creams to the wound while stitches/staples are in place, as this may cause delayed healing.  Notify the office if you experience any of the following signs of infection: Swelling, redness, pus drainage, streaking, fever >101.0 F  Notify the office if you experience excessive bleeding that does not stop after 15-20 minutes of constant, firm Pressure. Please take your antibiotics as directed, if you develop any side effects or have an allergic reaction please stop taking this come to the emergency department. If you have any new or worsening concerning symptoms please come back to the emergency department. Much to follow-up with your primary care in the next couple of days, please have your wound rechecked in the next week.
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
## 2020-09-09 ENCOUNTER — Emergency Department (HOSPITAL_COMMUNITY)
Admission: EM | Admit: 2020-09-09 | Discharge: 2020-09-09 | Disposition: A | Discharge status: Home / Self Care | Payer: Medicare Other | Attending: Emergency Medicine | Admitting: Emergency Medicine

## 2020-09-09 ENCOUNTER — Encounter (HOSPITAL_COMMUNITY): Payer: Self-pay

## 2020-09-09 ENCOUNTER — Emergency Department (HOSPITAL_COMMUNITY): Payer: Medicare Other

## 2020-09-09 ENCOUNTER — Other Ambulatory Visit: Payer: Self-pay

## 2020-09-09 DIAGNOSIS — Y9355 Activity, bike riding: Secondary | ICD-10-CM | POA: Diagnosis not present

## 2020-09-09 DIAGNOSIS — Y92482 Bike path as the place of occurrence of the external cause: Secondary | ICD-10-CM | POA: Diagnosis not present

## 2020-09-09 DIAGNOSIS — S63502A Unspecified sprain of left wrist, initial encounter: Secondary | ICD-10-CM

## 2020-09-09 DIAGNOSIS — Z87891 Personal history of nicotine dependence: Secondary | ICD-10-CM | POA: Insufficient documentation

## 2020-09-09 DIAGNOSIS — S6392XA Sprain of unspecified part of left wrist and hand, initial encounter: Secondary | ICD-10-CM | POA: Insufficient documentation

## 2020-09-09 DIAGNOSIS — S6992XA Unspecified injury of left wrist, hand and finger(s), initial encounter: Secondary | ICD-10-CM | POA: Diagnosis present
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
## 2020-09-09 NOTE — Discharge Instructions (Signed)
You can take Tylenol or Ibuprofen as directed for pain. You can alternate Tylenol and Ibuprofen every 4 hours. If you take Tylenol at 1pm, then you can take Ibuprofen at 5pm. Then you can take Tylenol again at 9pm.   Follow the RICE (Rest, Ice, Compression, Elevation) protocol as directed.   Wear the brace for support and stabilization.   Follow up with the referred hand doctor for any worsening or  concerning symptoms.   Return to the Emergency Dept for any worsening pain, numbness/weakness of the hand or any other worsening or concerning symptoms.
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
## 2021-06-06 ENCOUNTER — Emergency Department (HOSPITAL_COMMUNITY)
Admission: EM | Admit: 2021-06-06 | Discharge: 2021-06-06 | Disposition: A | Discharge status: Home / Self Care | Payer: Medicare Other | Attending: Emergency Medicine | Admitting: Emergency Medicine

## 2021-06-06 ENCOUNTER — Encounter (HOSPITAL_COMMUNITY): Payer: Self-pay

## 2021-06-06 ENCOUNTER — Other Ambulatory Visit: Payer: Self-pay

## 2021-06-06 DIAGNOSIS — H6693 Otitis media, unspecified, bilateral: Secondary | ICD-10-CM | POA: Diagnosis not present

## 2021-06-06 DIAGNOSIS — Z87891 Personal history of nicotine dependence: Secondary | ICD-10-CM | POA: Insufficient documentation

## 2021-06-06 DIAGNOSIS — J069 Acute upper respiratory infection, unspecified: Secondary | ICD-10-CM | POA: Diagnosis not present

## 2021-06-06 DIAGNOSIS — Z20822 Contact with and (suspected) exposure to covid-19: Secondary | ICD-10-CM | POA: Insufficient documentation

## 2021-06-06 DIAGNOSIS — R059 Cough, unspecified: Secondary | ICD-10-CM | POA: Diagnosis present

## 2021-06-06 DIAGNOSIS — H6690 Otitis media, unspecified, unspecified ear: Secondary | ICD-10-CM

## 2021-06-06 DIAGNOSIS — J029 Acute pharyngitis, unspecified: Secondary | ICD-10-CM

## 2021-06-06 LAB — RESP PANEL BY RT-PCR (FLU A&B, COVID) ARPGX2
Influenza A by PCR: NEGATIVE
Influenza B by PCR: NEGATIVE
SARS Coronavirus 2 by RT PCR: NEGATIVE

## 2021-06-06 LAB — GROUP A STREP BY PCR: Group A Strep by PCR: NOT DETECTED

## 2021-06-06 MED ORDER — LIDOCAINE VISCOUS HCL 2 % MT SOLN: 15.0000 mL | OROMUCOSAL | 0 refills | Status: AC | PRN

## 2021-06-06 MED ORDER — BENZONATATE 100 MG PO CAPS: 100.0000 mg | ORAL_CAPSULE | Freq: Three times a day (TID) | ORAL | 0 refills | Status: DC | PRN

## 2021-06-06 MED ORDER — AMOXICILLIN-POT CLAVULANATE 875-125 MG PO TABS: 1.0000 | ORAL_TABLET | Freq: Two times a day (BID) | ORAL | 0 refills | Status: DC

## 2021-06-06 NOTE — ED Provider Notes (Signed)
[facility] DEPT Provider Note   CSN: [id number] Arrival date & time: 06/06/21  1134     History Chief Complaint  Patient presents with   Sore Throat   Ear Pain    Eric Logan is a 35 y.o. male.  Presents emergency department with chief complaint of productive cough, sore throat and bilateral ear pain.  Patient states that his symptoms started after cleaning out his father's house.  Patient reports that sore throat has been present for 5 days.  Pain has been constant over this time.  Pain has remained unchanged.  Pain is worse when swallowing.  Patient has not tried any modalities to alleviate his symptoms.  Patient reports that he has had bilateral ear pain over the last 3 days.  Pain has been constant and unchanged.  Denies any associated hearing loss or otorrhea.  Patient has not tried any modalities to alleviate his pain.  Patient states that he has had a productive cough over the last 5 days.  States that cough is producing green mucus.  Patient denies any known sick contacts.  No fevers, chills, generalized myalgias, trouble swallowing, trismus, hot potato voice, drooling, chest pain, shortness of breath, abdominal pain, nausea, vomiting, diarrhea.   Sore Throat Pertinent negatives include no chest pain, no abdominal pain, no headaches and no shortness of breath.      Past Medical History:  Diagnosis Date   ADHD (attention deficit hyperactivity disorder)    Bipolar 1 disorder [facility])     Patient Active Problem List   Diagnosis Date Noted   Other acute sinusitis 04/30/2017   Immunization deficiency 02/27/2017   Acne 02/27/2017   Bipolar affective disorder, manic, severe, with psychotic behavior ([place]) 09/02/2016   Attention deficit hyperactivity disorder (ADHD), combined type 09/02/2016   History of oppositional defiant disorder 12/22/2014   Former smoker 02/17/2014   SHOULDER PAIN, RIGHT 08/23/2009   ADD 07/13/2008    Past Surgical  History:  Procedure Laterality Date   APPENDECTOMY     WISDOM TOOTH EXTRACTION         History reviewed. No pertinent family history.  Social History   Tobacco Use   Smoking status: Former   Smokeless tobacco: Never  Substance Use Topics   Alcohol use: No    Comment: ocasional   Drug use: No    Home Medications Prior to Admission medications   Medication Sig Start Date End Date Taking? Authorizing Provider  amantadine (SYMMETREL) 100 MG capsule Take 1 capsule (100 mg total) by mouth daily. 09/02/16   Patrecia Pour, NP  ARIPiprazole (ABILIFY) 10 MG tablet Take 1 tablet (10 mg total) by mouth every morning. Patient not taking: Reported on 12/23/2017 09/02/16   Patrecia Pour, NP  ARIPiprazole (ABILIFY) 20 MG tablet Take 20 mg by mouth daily.    [provider]  atomoxetine (STRATTERA) 80 MG capsule Take 80 mg by mouth daily.    [provider]  cephALEXin (KEFLEX) 500 MG capsule Take 1 capsule (500 mg total) by mouth 4 (four) times daily. 04/29/20   Alfredia Client, PA-C  erythromycin with ethanol [facility]) 2 % external solution Apply to back twice daily for acne 02/26/17   Dickie La, MD  HYDROcodone-acetaminophen (NORCO/VICODIN) 5-325 MG tablet Take 1 tablet by mouth every 4 (four) hours as needed. 12/23/17   Recardo Evangelist, PA-C  ibuprofen (ADVIL,MOTRIN) 600 MG tablet Take 1 tablet (600 mg total) by mouth every 6 (six) hours as needed.  12/23/17   Bethel Born, PA-C    Allergies    Patient has no known allergies.  Review of Systems   Review of Systems  Constitutional:  Negative for chills and fever.  HENT:  Positive for congestion, ear pain, rhinorrhea and sore throat. Negative for drooling, ear discharge, facial swelling, trouble swallowing and voice change.   Eyes:  Negative for visual disturbance.  Respiratory:  Positive for cough. Negative for shortness of breath.   Cardiovascular:  Negative for chest pain.  Gastrointestinal:  Negative for  abdominal pain, nausea and vomiting.  Genitourinary:  Negative for difficulty urinating and dysuria.  Musculoskeletal:  Negative for back pain and neck pain.  Skin:  Negative for color change and rash.  Neurological:  Negative for dizziness, syncope, light-headedness and headaches.  Psychiatric/Behavioral:  Negative for confusion.    Physical Exam Updated Vital Signs BP 130/77 (BP Location: Left Arm)    Pulse 79    Temp 98.1 F (36.7 C) (Oral)    Resp 18    Ht 5\' 9"  (1.753 m)    Wt 98 kg    SpO2 100%    BMI 31.90 kg/m   Physical Exam Vitals and nursing note reviewed.  Constitutional:      General: He is not in acute distress.    Appearance: He is not ill-appearing, toxic-appearing or diaphoretic.  HENT:     Head: Normocephalic. No raccoon eyes, right periorbital erythema or left periorbital erythema.     Jaw: There is normal jaw occlusion. No trismus, tenderness, swelling, pain on movement or malocclusion.     Right Ear: Ear canal and external ear normal. No laceration, drainage, swelling or tenderness. No mastoid tenderness. Tympanic membrane is erythematous and bulging.     Left Ear: Tympanic membrane, ear canal and external ear normal. No laceration, drainage, swelling or tenderness. No mastoid tenderness.     Ears:     Comments: No auricle proptosis bilaterally    Mouth/Throat:     Lips: Pink. No lesions.     Mouth: Mucous membranes are moist.     Tongue: No lesions. Tongue does not deviate from midline.     Palate: No mass and lesions.     Pharynx: Oropharynx is clear. Uvula midline. No pharyngeal swelling, oropharyngeal exudate, posterior oropharyngeal erythema or uvula swelling.     Tonsils: No tonsillar exudate or tonsillar abscesses. 1+ on the right. 1+ on the left.     Comments: Handles oral secretions without difficulty Eyes:     General: No scleral icterus.       Right eye: No discharge.        Left eye: No discharge.  Cardiovascular:     Rate and Rhythm: Normal  rate.  Pulmonary:     Effort: Pulmonary effort is normal. No tachypnea, bradypnea or respiratory distress.     Breath sounds: Normal breath sounds. No stridor. No wheezing, rhonchi or rales.     Comments: Speaks in full complete sentences Musculoskeletal:     Cervical back: Full passive range of motion without pain, normal range of motion and neck supple. No edema, erythema, signs of trauma, rigidity, torticollis or crepitus. No pain with movement, spinous process tenderness or muscular tenderness. Normal range of motion.  Lymphadenopathy:     Cervical: No cervical adenopathy.  Skin:    General: Skin is warm and dry.  Neurological:     General: No focal deficit present.     Mental Status: He is alert.  Psychiatric:        Behavior: Behavior is cooperative.    ED Results / Procedures / Treatments   Labs (all labs ordered are listed, but only abnormal results are displayed) Labs Reviewed  RESP PANEL BY RT-PCR (FLU A&B, COVID) ARPGX2  GROUP A STREP BY PCR    EKG None  Radiology No results found.  Procedures Procedures   Medications Ordered in ED Medications - No data to display  ED Course  I have reviewed the triage vital signs and the nursing notes.  Pertinent labs & imaging results that were available during my care of the patient were reviewed by me and considered in my medical decision making (see chart for details).    MDM Rules/Calculators/A&P                          Alert 35 year old male no distress, nontoxic-appearing.  Presents to ED with chief complaint of productive cough, bilateral ear pain, and sore throat.  No signs of tonsillitis, strep pharyngitis, or pharyngitis.  No swelling to submandibular space.  No signs of peritonsillar abscess.  Patient has full passive range of motion to neck without pain.  Low suspicion for Ludewig's angina or deep space neck infection at this time.  We will swab patient for strep pharyngitis.  Patient negative for strep  pharyngitis.  Suspect that symptoms are likely due to postnasal drip versus viral upper respiratory infection.  Lungs clear to auscultation bilaterally.  Speaks in full complete sentences without difficulty.  Suspicion for pneumonia at this time.  Patient negative for influenza and COVID-19.  Patient's right ear shows signs consistent with acute otitis media.  We will start patient on 7-day course of Augmentin.  Will prescribe patient with Tessalon to help with his cough and viscous lidocaine to help with the sore throat.  Patient given strict return precautions.  Discussed results, findings, treatment and follow up. Patient advised of return precautions. Patient verbalized understanding and agreed with plan.      Final Clinical Impression(s) / ED Diagnoses Final diagnoses:  Viral upper respiratory tract infection  Acute otitis media, unspecified otitis media type  Sore throat    Rx / DC Orders ED Discharge Orders          Ordered    benzonatate (TESSALON) 100 MG capsule  Every 8 hours PRN        06/06/21 1409    lidocaine (XYLOCAINE) 2 % solution  Every 4 hours PRN        06/06/21 1409    amoxicillin-clavulanate (AUGMENTIN) 875-125 MG tablet  Every 12 hours        06/06/21 1409             Dyann Ruddle 06/06/21 1418    Daleen Bo, MD 06/07/21 [phone]

## 2021-06-06 NOTE — Discharge Instructions (Addendum)
[facility] to be evaluated for your sore throat, cough, and ear pain.  You were negative for COVID-19, influenza, and strep throat.  Your cough and sore throat is likely due to a viral infection.  He was found to have a ear infection in your right ear.  Due to this I started you on the antibiotic Augmentin.  Please take this medication as prescribed.  I have also given you prescription for Tessalon, you may take 1 pill every 8 hours to help with your cough.  I have also given you prescription for viscous lidocaine, you may take this every 4 hours help with the sore throat.  You may have diarrhea from the antibiotics.  It is very important that you continue to take the antibiotics even if you get diarrhea unless a medical professional tells you that you may stop taking them.  If you stop too early the bacteria you are being treated for will become stronger and you may need different, more powerful antibiotics that have more side effects and worsening diarrhea.  Please stay well hydrated and consider probiotics as they may decrease the severity of your diarrhea.   Get help right away if you: Feel pain or pressure in your chest. Have trouble breathing. Faint or feel like you will faint. Have severe and persistent vomiting. Feel confused or disoriented. You have severe pain that is not controlled with medicine. You have swelling, redness, or pain around your ear. You have stiffness in your neck. A part of your face is not moving (paralyzed). The bone behind your ear (mastoid bone) is tender when you touch it. You develop a severe headache.

## 2021-06-06 NOTE — ED Provider Notes (Signed)
Emergency Medicine Provider Triage Evaluation Note  Eric Logan , a 35 y.o. male  was evaluated in triage.  Pt complains of sore throat and bilateral otalgia.  Reports that sore throat has been present for 5 days and bilateral otalgia has been present for 3 days.   Review of Systems  Positive: Bilateral otalgia, sore throat Negative: Trismus, hot potato voice, drooling, neck stiffness, fever, chills, rhinorrhea, nasal congestion, generalized myalgias  Physical Exam  BP 130/77 (BP Location: Left Arm)    Pulse 79    Temp 98.1 F (36.7 C) (Oral)    Resp 18    Ht 5\' 9"  (1.753 m)    Wt 98 kg    SpO2 100%    BMI 31.90 kg/m  Gen:   Awake, no distress   Resp:  Normal effort, clear to auscultation bilaterally MSK:   Moves extremities without difficulty  Other:  No pain with passive range of motion of neck.  Enlarged tonsils bilaterally.  No peritonsillar abscess.  No swelling to submandibular space.  No mastoid tenderness bilaterally.  Left TM unremarkable.  Right TM is erythematous.  Abrasion to right ear canal.  Medical Decision Making  Medically screening exam initiated at 12:13 PM.  Appropriate orders placed.  Eric Logan was informed that the remainder of the evaluation will be completed by another provider, this initial triage assessment does not replace that evaluation, and the importance of remaining in the ED until their evaluation is complete.  We will swab patient for strep and COVID/influenza.   Eric Logan 06/06/21 1214    06/08/21, MD 06/07/21 [phone]

## 2021-06-06 NOTE — ED Triage Notes (Signed)
Pt reports sore throat and right ear pain x4-5 days. Denies fever.

## 2021-08-16 ENCOUNTER — Ambulatory Visit (INDEPENDENT_AMBULATORY_CARE_PROVIDER_SITE_OTHER): Payer: Medicare Other

## 2021-08-16 ENCOUNTER — Ambulatory Visit (HOSPITAL_COMMUNITY)
Admission: EM | Admit: 2021-08-16 | Discharge: 2021-08-16 | Disposition: A | Discharge status: Home / Self Care | Payer: Medicare Other | Attending: Physician Assistant | Admitting: Physician Assistant

## 2021-08-16 ENCOUNTER — Other Ambulatory Visit: Payer: Self-pay

## 2021-08-16 ENCOUNTER — Encounter (HOSPITAL_COMMUNITY): Payer: Self-pay | Admitting: Physician Assistant

## 2021-08-16 DIAGNOSIS — J329 Chronic sinusitis, unspecified: Secondary | ICD-10-CM | POA: Diagnosis present

## 2021-08-16 DIAGNOSIS — R051 Acute cough: Secondary | ICD-10-CM | POA: Diagnosis present

## 2021-08-16 DIAGNOSIS — R059 Cough, unspecified: Secondary | ICD-10-CM

## 2021-08-16 DIAGNOSIS — R112 Nausea with vomiting, unspecified: Secondary | ICD-10-CM | POA: Insufficient documentation

## 2021-08-16 DIAGNOSIS — J40 Bronchitis, not specified as acute or chronic: Secondary | ICD-10-CM

## 2021-08-16 LAB — CBC WITH DIFFERENTIAL/PLATELET
Abs Immature Granulocytes: 0.02 10*3/uL (ref 0.00–0.07)
Basophils Absolute: 0.1 10*3/uL (ref 0.0–0.1)
Basophils Relative: 1 %
Eosinophils Absolute: 0.3 10*3/uL (ref 0.0–0.5)
Eosinophils Relative: 3 %
HCT: 47.8 % (ref 39.0–52.0)
Hemoglobin: 15.7 g/dL (ref 13.0–17.0)
Immature Granulocytes: 0 %
Lymphocytes Relative: 21 %
Lymphs Abs: 1.5 10*3/uL (ref 0.7–4.0)
MCH: 28.2 pg (ref 26.0–34.0)
MCHC: 32.8 g/dL (ref 30.0–36.0)
MCV: 86 fL (ref 80.0–100.0)
Monocytes Absolute: 1 10*3/uL (ref 0.1–1.0)
Monocytes Relative: 14 %
Neutro Abs: 4.4 10*3/uL (ref 1.7–7.7)
Neutrophils Relative %: 61 %
Platelets: 321 10*3/uL (ref 150–400)
RBC: 5.56 MIL/uL (ref 4.22–5.81)
RDW: 14.1 % (ref 11.5–15.5)
WBC: 7.3 10*3/uL (ref 4.0–10.5)
nRBC: 0 % (ref 0.0–0.2)

## 2021-08-16 LAB — COMPREHENSIVE METABOLIC PANEL
ALT: 26 U/L (ref 0–44)
AST: 29 U/L (ref 15–41)
Albumin: 4.3 g/dL (ref 3.5–5.0)
Alkaline Phosphatase: 66 U/L (ref 38–126)
Anion gap: 9 (ref 5–15)
BUN: 16 mg/dL (ref 6–20)
CO2: 27 mmol/L (ref 22–32)
Calcium: 9.4 mg/dL (ref 8.9–10.3)
Chloride: 102 mmol/L (ref 98–111)
Creatinine, Ser: 1.23 mg/dL (ref 0.61–1.24)
GFR, Estimated: >60 mL/min (ref >60)
Glucose, Bld: 89 mg/dL (ref 70–99)
Potassium: 4.5 mmol/L (ref 3.5–5.1)
Sodium: 138 mmol/L (ref 135–145)
Total Bilirubin: 1.3 mg/dL — ABNORMAL HIGH (ref 0.3–1.2)
Total Protein: 7.6 g/dL (ref 6.5–8.1)

## 2021-08-16 MED ORDER — ONDANSETRON 4 MG PO TBDP
4.0000 mg | ORAL_TABLET | Freq: Once | ORAL | Status: AC
  Administered 2021-08-16: 4 mg via ORAL

## 2021-08-16 MED ORDER — PROMETHAZINE-DM 6.25-15 MG/5ML PO SYRP: 5.0000 mL | ORAL_SOLUTION | Freq: Two times a day (BID) | ORAL | 0 refills | Status: AC | PRN

## 2021-08-16 MED ORDER — ONDANSETRON 4 MG PO TBDP: 4.0000 mg | ORAL_TABLET | Freq: Three times a day (TID) | ORAL | 0 refills | Status: AC | PRN

## 2021-08-16 MED ORDER — AMOXICILLIN-POT CLAVULANATE 875-125 MG PO TABS: 1.0000 | ORAL_TABLET | Freq: Two times a day (BID) | ORAL | 0 refills | Status: AC

## 2021-08-16 MED ORDER — ONDANSETRON 4 MG PO TBDP
ORAL_TABLET | ORAL | Status: AC
  Filled 2021-08-16: qty 1

## 2021-08-16 MED ORDER — PREDNISONE 10 MG PO TABS: 20.0000 mg | ORAL_TABLET | Freq: Every day | ORAL | 0 refills | Status: AC

## 2021-08-16 NOTE — ED Provider Notes (Signed)
[facility]    CSN: [id number] Arrival date & time: 08/16/21  1123      History   Chief Complaint Chief Complaint  Patient presents with   Cough   Emesis    HPI Eric Logan is a 36 y.o. male.   Patient presents today with 3-day history of cough.  Reports cough is productive with thick sputum.  He is also developed nausea and vomiting to the point that he has had difficulty keeping food or liquids down for the past 3 days.  He reports widespread body pain as well as headache.  Denies any abdominal pain, chest pain, shortness of breath, nasal congestion, sore throat.  Denies any known sick contacts.  Denies any history of asthma, allergies, COPD.  He does not smoke currently.  He denies any suspicious food intake, recent travel, medication changes, recent antibiotic use.  He denies history of gastrointestinal disorder.  He did have his appendix removed at age 82 but denies additional abdominal surgery.  Reports that he is feeling terrible prompting evaluation today.   Past Medical History:  Diagnosis Date   ADHD (attention deficit hyperactivity disorder)    Bipolar 1 disorder ([facility])     Patient Active Problem List   Diagnosis Date Noted   Other acute sinusitis 04/30/2017   Immunization deficiency 02/27/2017   Acne 02/27/2017   Bipolar affective disorder, manic, severe, with psychotic behavior ([facility]) 09/02/2016   Attention deficit hyperactivity disorder (ADHD), combined type 09/02/2016   History of oppositional defiant disorder 12/22/2014   Former smoker 02/17/2014   SHOULDER PAIN, RIGHT 08/23/2009   ADD 07/13/2008    Past Surgical History:  Procedure Laterality Date   APPENDECTOMY     WISDOM TOOTH EXTRACTION         Home Medications    Prior to Admission medications   Medication Sig Start Date End Date Taking? Authorizing Provider  ondansetron (ZOFRAN-ODT) 4 MG disintegrating tablet Take 1 tablet (4 mg total) by mouth every 8 (eight) hours as needed  for nausea or vomiting. 08/16/21  Yes Suzanne Kho K, PA-C  predniSONE (DELTASONE) 10 MG tablet Take 2 tablets (20 mg total) by mouth daily for 3 days. 08/16/21 08/19/21 Yes Sergio Zawislak K, PA-C  promethazine-dextromethorphan (PROMETHAZINE-DM) 6.25-15 MG/5ML syrup Take 5 mLs by mouth 2 (two) times daily as needed for cough. 08/16/21  Yes Doris Mcgilvery K, PA-C  amantadine (SYMMETREL) 100 MG capsule Take 1 capsule (100 mg total) by mouth daily. 09/02/16   Charm Rings, NP  amoxicillin-clavulanate (AUGMENTIN) 875-125 MG tablet Take 1 tablet by mouth every 12 (twelve) hours. 08/16/21   Ford Peddie K, PA-C  ARIPiprazole (ABILIFY) 10 MG tablet Take 1 tablet (10 mg total) by mouth every morning. Patient not taking: Reported on 12/23/2017 09/02/16   Charm Rings, NP  ARIPiprazole (ABILIFY) 20 MG tablet Take 20 mg by mouth daily.    [provider]  atomoxetine (STRATTERA) 80 MG capsule Take 80 mg by mouth daily.    [provider]  erythromycin with ethanol (THERAMYCIN) 2 % external solution Apply to back twice daily for acne 02/26/17   Nestor Ramp, MD  HYDROcodone-acetaminophen (NORCO/VICODIN) 5-325 MG tablet Take 1 tablet by mouth every 4 (four) hours as needed. 12/23/17   Bethel Born, PA-C  ibuprofen (ADVIL,MOTRIN) 600 MG tablet Take 1 tablet (600 mg total) by mouth every 6 (six) hours as needed. 12/23/17   Bethel Born, PA-C  lidocaine (XYLOCAINE) 2 % solution Use  as directed 15 mLs in the mouth or throat every 4 (four) hours as needed for mouth pain. 06/06/21   Haskel SchroederBadalamente, Peter R, PA-C    Family History History reviewed. No pertinent family history.  Social History Social History   Tobacco Use   Smoking status: Former   Smokeless tobacco: Never  Substance Use Topics   Alcohol use: No    Comment: ocasional   Drug use: No     Allergies   Patient has no known allergies.   Review of Systems Review of Systems  Constitutional:  Positive for activity change. Negative  for appetite change, fatigue and fever.  HENT:  Negative for congestion and sore throat.   Respiratory:  Positive for cough. Negative for shortness of breath.   Cardiovascular:  Negative for chest pain.  Gastrointestinal:  Positive for nausea and vomiting. Negative for abdominal pain, blood in stool, constipation and diarrhea.  Neurological:  Positive for headaches. Negative for dizziness and light-headedness.    Physical Exam Triage Vital Signs ED Triage Vitals  Enc Vitals Group     BP 08/16/21 1321 123/82     Pulse Rate 08/16/21 1321 84     Resp 08/16/21 1321 18     Temp 08/16/21 1321 99.3 F (37.4 C)     Temp Source 08/16/21 1321 Oral     SpO2 08/16/21 1321 97 %     Weight --      Height --      Head Circumference --      Peak Flow --      Pain Score 08/16/21 1320 4     Pain Loc --      Pain Edu? --      Excl. in GC? --    No data found.  Updated Vital Signs BP 123/82 (BP Location: Right Arm)    Pulse 84    Temp 99.3 F (37.4 C) (Oral)    Resp 18    SpO2 97%   Visual Acuity Right Eye Distance:   Left Eye Distance:   Bilateral Distance:    Right Eye Near:   Left Eye Near:    Bilateral Near:     Physical Exam Vitals reviewed.  Constitutional:      General: He is awake.     Appearance: Normal appearance. He is well-developed. He is not ill-appearing.     Comments: Very pleasant male appears stated age laying on exam room table in no acute distress  HENT:     Head: Normocephalic and atraumatic.     Right Ear: Tympanic membrane, ear canal and external ear normal. Tympanic membrane is not erythematous or bulging.     Left Ear: Tympanic membrane, ear canal and external ear normal. Tympanic membrane is not erythematous or bulging.     Nose: Nose normal.     Mouth/Throat:     Pharynx: Uvula midline. No oropharyngeal exudate, posterior oropharyngeal erythema or uvula swelling.  Cardiovascular:     Rate and Rhythm: Normal rate and regular rhythm.     Heart sounds:  Normal heart sounds, S1 normal and S2 normal. No murmur heard. Pulmonary:     Effort: Pulmonary effort is normal. No accessory muscle usage or respiratory distress.     Breath sounds: Normal breath sounds. No stridor. No wheezing, rhonchi or rales.     Comments: Clear to auscultation bilaterally Abdominal:     General: Bowel sounds are normal.     Palpations: Abdomen is soft.  Tenderness: There is abdominal tenderness in the left upper quadrant. There is no right CVA tenderness, left CVA tenderness, guarding or rebound.     Comments: Tenderness palpation in left upper quadrant.  No evidence of acute abdomen on physical exam  Neurological:     Mental Status: He is alert.  Psychiatric:        Behavior: Behavior is cooperative.     UC Treatments / Results  Labs (all labs ordered are listed, but only abnormal results are displayed) Labs Reviewed  CBC WITH DIFFERENTIAL/PLATELET  COMPREHENSIVE METABOLIC PANEL    EKG   Radiology DG Chest 2 View  Result Date: 08/16/2021 CLINICAL DATA:  Cough.  Nausea and vomiting.  Headache. EXAM: CHEST - 2 VIEW COMPARISON:  None. FINDINGS: Heart size is normal. No evidence of mediastinal mass. There is central bronchial thickening but no consolidation, collapse or effusion. Scoliotic curvature convex to the right is noted. IMPRESSION: Bronchitis pattern.  No consolidation or collapse. Scoliosis. Electronically Signed   By: Paulina Fusi M.D.   On: 08/16/2021 14:59    Procedures Procedures (including critical care time)  Medications Ordered in UC Medications  ondansetron (ZOFRAN-ODT) disintegrating tablet 4 mg (4 mg Oral Given 08/16/21 1433)    Initial Impression / Assessment and Plan / UC Course  I have reviewed the triage vital signs and the nursing notes.  Pertinent labs & imaging results that were available during my care of the patient were reviewed by me and considered in my medical decision making (see chart for details).     X-ray  obtained given severe cough showed bronchitis changes.  CBC and CMP are pending and we will contact patient if anything is abnormal we need to change his treatment plan.  Suspect that this is contributing to nausea and vomiting through posttussive emesis.  Will cover with low-dose steroids and he was instructed to not take NSAIDs with this medication due to risk of GI bleeding.  Augmentin started.  Patient was given Zofran in clinic and able to pass oral challenge by drinking soda and saltine crackers without difficulty.  He was sent home on Zofran with instruction to use this on a scheduled basis for the next several days to encourage oral intake.  Discussed that if he has nausea/vomiting despite medication use needs to get to the emergency room.  He was unable to give [place] a urine specimen in clinic and discussed that if he is unable to urinate for 6 to 12 hours he needs to go to the emergency room.  He was given Promethazine DM for cough with instruction not to drive or drink alcohol with this medication as drowsiness is a common side effect.  He is to use over-the-counter medication including Tylenol, Mucinex, Flonase.  Discussed alarm symptoms that warrant emergent evaluation.  Strict return precautions given to which patient expressed understanding.  Work excuse note provided.  Final Clinical Impressions(s) / UC Diagnoses   Final diagnoses:  Sinobronchitis  Acute cough  Nausea and vomiting, unspecified vomiting type     Discharge Instructions      Your x-ray showed some bronchitis changes.  Please start Augmentin twice daily.  Take prednisone 20 mg for 3 days.  Avoid NSAIDs including aspirin, ibuprofen/Advil, naproxen/Aleve with this medication as it can cause stomach bleeding.  You can use Tylenol, Mucinex, Flonase for additional symptom relief.  Use ondansetron on a scheduled basis for the next 2 days to ensure that you are eating and drinking normally.  If you  end up vomiting despite use of this  medication or go an additional 6 to 12 hours without urinating you need to go to the emergency room as we discussed.  Take Promethazine DM for cough.  This to make you sleepy so do not drive or drink alcohol while taking it.  Make sure you rest and drink plenty of fluid.  If your symptoms or not improving or if at any point anything worsens you must go to the hospital as we discussed.     ED Prescriptions     Medication Sig Dispense Auth. Provider   amoxicillin-clavulanate (AUGMENTIN) 875-125 MG tablet Take 1 tablet by mouth every 12 (twelve) hours. 14 tablet Kahlen Boyde K, PA-C   promethazine-dextromethorphan (PROMETHAZINE-DM) 6.25-15 MG/5ML syrup Take 5 mLs by mouth 2 (two) times daily as needed for cough. 118 mL Gerard Bonus K, PA-C   ondansetron (ZOFRAN-ODT) 4 MG disintegrating tablet Take 1 tablet (4 mg total) by mouth every 8 (eight) hours as needed for nausea or vomiting. 20 tablet Masie Bermingham K, PA-C   predniSONE (DELTASONE) 10 MG tablet Take 2 tablets (20 mg total) by mouth daily for 3 days. 6 tablet Kaisyn Millea, Noberto Retort, PA-C      PDMP not reviewed this encounter.   Jeani Hawking, PA-C 08/16/21 1551

## 2021-08-16 NOTE — Discharge Instructions (Signed)
Your x-ray showed some bronchitis changes.  Please start Augmentin twice daily.  Take prednisone 20 mg for 3 days.  Avoid NSAIDs including aspirin, ibuprofen/Advil, naproxen/Aleve with this medication as it can cause stomach bleeding.  You can use Tylenol, Mucinex, Flonase for additional symptom relief.  Use ondansetron on a scheduled basis for the next 2 days to ensure that you are eating and drinking normally.  If you end up vomiting despite use of this medication or go an additional 6 to 12 hours without urinating you need to go to the emergency room as we discussed.  Take Promethazine DM for cough.  This to make you sleepy so do not drive or drink alcohol while taking it.  Make sure you rest and drink plenty of fluid.  If your symptoms or not improving or if at any point anything worsens you must go to the hospital as we discussed. ?

## 2021-08-16 NOTE — ED Triage Notes (Signed)
Pt reports not being able to hold food down and coughing up phlegm. States he has a sever headache and states his lungs hurt.  ?

## 2022-05-08 ENCOUNTER — Encounter (HOSPITAL_COMMUNITY): Payer: Self-pay

## 2022-05-08 ENCOUNTER — Emergency Department (HOSPITAL_COMMUNITY): Payer: Medicare Other

## 2022-05-08 ENCOUNTER — Other Ambulatory Visit: Payer: Self-pay

## 2022-05-08 ENCOUNTER — Emergency Department (HOSPITAL_COMMUNITY)
Admission: EM | Admit: 2022-05-08 | Discharge: 2022-05-08 | Disposition: A | Discharge status: Home / Self Care | Payer: Medicare Other | Attending: Emergency Medicine | Admitting: Emergency Medicine

## 2022-05-08 DIAGNOSIS — H61001 Unspecified perichondritis of right external ear: Secondary | ICD-10-CM

## 2022-05-08 DIAGNOSIS — H61011 Acute perichondritis of right external ear: Secondary | ICD-10-CM | POA: Insufficient documentation

## 2022-05-08 DIAGNOSIS — H60311 Diffuse otitis externa, right ear: Secondary | ICD-10-CM

## 2022-05-08 DIAGNOSIS — H9201 Otalgia, right ear: Secondary | ICD-10-CM | POA: Diagnosis present

## 2022-05-08 LAB — BASIC METABOLIC PANEL
Anion gap: 9 (ref 5–15)
BUN: 15 mg/dL (ref 6–20)
CO2: 24 mmol/L (ref 22–32)
Calcium: 9 mg/dL (ref 8.9–10.3)
Chloride: 106 mmol/L (ref 98–111)
Creatinine, Ser: 0.94 mg/dL (ref 0.61–1.24)
GFR, Estimated: >60 mL/min (ref >60)
Glucose, Bld: 93 mg/dL (ref 70–99)
Potassium: 4.1 mmol/L (ref 3.5–5.1)
Sodium: 139 mmol/L (ref 135–145)

## 2022-05-08 LAB — CBC
HCT: 45.8 % (ref 39.0–52.0)
Hemoglobin: 15.1 g/dL (ref 13.0–17.0)
MCH: 28.7 pg (ref 26.0–34.0)
MCHC: 33 g/dL (ref 30.0–36.0)
MCV: 86.9 fL (ref 80.0–100.0)
Platelets: 355 10*3/uL (ref 150–400)
RBC: 5.27 MIL/uL (ref 4.22–5.81)
RDW: 13.8 % (ref 11.5–15.5)
WBC: 17 10*3/uL — ABNORMAL HIGH (ref 4.0–10.5)
nRBC: 0 % (ref 0.0–0.2)

## 2022-05-08 MED ORDER — MORPHINE SULFATE (PF) 2 MG/ML IV SOLN: 2.0000 mg | Freq: Once | INTRAVENOUS | Status: DC

## 2022-05-08 MED ORDER — SODIUM CHLORIDE (PF) 0.9 % IJ SOLN
INTRAMUSCULAR | Status: AC
  Filled 2022-05-08: qty 50

## 2022-05-08 MED ORDER — KETOROLAC TROMETHAMINE 15 MG/ML IJ SOLN
15.0000 mg | Freq: Once | INTRAMUSCULAR | Status: AC
  Administered 2022-05-08: 15 mg via INTRAVENOUS
  Filled 2022-05-08: qty 1

## 2022-05-08 MED ORDER — CIPROFLOXACIN-DEXAMETHASONE 0.3-0.1 % OT SUSP
4.0000 [drp] | OTIC | Status: AC
  Administered 2022-05-08: 4 [drp] via OTIC
  Filled 2022-05-08: qty 7.5

## 2022-05-08 MED ORDER — LEVOFLOXACIN 500 MG PO TABS: 750.0000 mg | ORAL_TABLET | Freq: Every day | ORAL | 0 refills | Status: AC

## 2022-05-08 MED ORDER — LEVOFLOXACIN 750 MG PO TABS
750.0000 mg | ORAL_TABLET | ORAL | Status: AC
  Administered 2022-05-08: 750 mg via ORAL
  Filled 2022-05-08: qty 1

## 2022-05-08 MED ORDER — IOHEXOL 300 MG/ML  SOLN
75.0000 mL | Freq: Once | INTRAMUSCULAR | Status: AC | PRN
  Administered 2022-05-08: 75 mL via INTRAVENOUS

## 2022-05-08 NOTE — ED Provider Notes (Signed)
  Physical Exam  BP (!) 152/98   Pulse 90   Temp 98.5 F (36.9 C) (Oral)   Resp 16   Ht 5\' 10"  (1.778 m)   Wt 99.8 kg   SpO2 98%   BMI 31.57 kg/m   Physical Exam  Procedures  Procedures  ED Course / MDM   Clinical Course as of 05/08/22 0914  Wed May 08, 2022  0656 Assumed care.  36 year old male with a history of otitis externa who presents to the emergency department with worsening pain and mastoid tenderness on exam.  Obtaining CT temporal bones to evaluate for mastoiditis.  Patient will require ear wick and continued eardrops if negative. [RP]  0809 CT scan shows evidence of severe otitis externa but no evidence of mastoiditis. [RP]  0907 Patient reevaluated.  Reports persistent pain.  Still having swelling of the pinna of the ear no swelling of the mastoid.  No meningismus.  Will place ear wick now.  Due to concerns for perichondritis will treat with Levaquin p.o. with continued Ciprodex drops and have him follow-up with ENT.  Return precautions discussed prior to discharge. [RP]    Clinical Course User Index [RP] 0908, MD   Medical Decision Making Amount and/or Complexity of Data Reviewed Labs: ordered. Radiology: ordered.  Risk Prescription drug management.     Rondel Baton, MD 05/08/22 [id number]

## 2022-05-08 NOTE — ED Triage Notes (Signed)
Pt reports having a right ear infection x 7 days. Pt is on abts (drops) currently but is not getting better and states that the whole right side of his head feels like it is going to explode. Pt is also taking Ibuprofen with no relief.

## 2022-05-08 NOTE — Discharge Instructions (Addendum)
At home please continue the eardrops you are prescribed and take the oral antibiotics we have prescribed you to prevent worsening infection.  Follow-up with ENT as soon as possible preferably in the next 4 to 5 days.  Return to the emergency department if you have any of the following: Severe headache, neck stiffness, vision changes, or any other concerning symptoms.

## 2022-05-08 NOTE — ED Notes (Signed)
Pt went to CT

## 2022-05-08 NOTE — ED Provider Notes (Signed)
[facility]-EMERGENCY DEPT Provider Note   CSN: [id number] Arrival date & time: 05/08/22  0427     History  Chief Complaint  Patient presents with   Otalgia    Eric Logan is a 36 y.o. male with noncontributory past medical history who presents with concern for right ear infection for last 7 days.  Patient has Ciprodex drops, however reports he is not getting any better, and the whole right side of his head feels like is going to explode.  Reports he is taking ibuprofen with no relief.  Denies significant fever, chills, reports that he feels like the right side of his face is swollen.   Otalgia      Home Medications Prior to Admission medications   Medication Sig Start Date End Date Taking? Authorizing Provider  levofloxacin (LEVAQUIN) 500 MG tablet Take 1.5 tablets (750 mg total) by mouth daily for 10 days. 05/08/22 05/18/22 Yes Rondel Baton, MD  amantadine (SYMMETREL) 100 MG capsule Take 1 capsule (100 mg total) by mouth daily. 09/02/16   Charm Rings, NP  amoxicillin-clavulanate (AUGMENTIN) 875-125 MG tablet Take 1 tablet by mouth every 12 (twelve) hours. 08/16/21   Raspet, Erin K, PA-C  ARIPiprazole (ABILIFY) 10 MG tablet Take 1 tablet (10 mg total) by mouth every morning. Patient not taking: Reported on 12/23/2017 09/02/16   Charm Rings, NP  ARIPiprazole (ABILIFY) 20 MG tablet Take 20 mg by mouth daily.    [provider]  atomoxetine (STRATTERA) 80 MG capsule Take 80 mg by mouth daily.    [provider]  erythromycin with ethanol (THERAMYCIN) 2 % external solution Apply to back twice daily for acne 02/26/17   Nestor Ramp, MD  HYDROcodone-acetaminophen (NORCO/VICODIN) 5-325 MG tablet Take 1 tablet by mouth every 4 (four) hours as needed. 12/23/17   Bethel Born, PA-C  ibuprofen (ADVIL,MOTRIN) 600 MG tablet Take 1 tablet (600 mg total) by mouth every 6 (six) hours as needed. 12/23/17   Bethel Born, PA-C  lidocaine  (XYLOCAINE) 2 % solution Use as directed 15 mLs in the mouth or throat every 4 (four) hours as needed for mouth pain. 06/06/21   Haskel Schroeder, PA-C  ondansetron (ZOFRAN-ODT) 4 MG disintegrating tablet Take 1 tablet (4 mg total) by mouth every 8 (eight) hours as needed for nausea or vomiting. 08/16/21   Raspet, Noberto Retort, PA-C  promethazine-dextromethorphan (PROMETHAZINE-DM) 6.25-15 MG/5ML syrup Take 5 mLs by mouth 2 (two) times daily as needed for cough. 08/16/21   Raspet, Noberto Retort, PA-C      Allergies    Patient has no known allergies.    Review of Systems   Review of Systems  HENT:  Positive for ear pain.   All other systems reviewed and are negative.   Physical Exam Updated Vital Signs BP (!) 152/98   Pulse 90   Temp 98.5 F (36.9 C) (Oral)   Resp 16   Ht 5\' 10"  (1.778 m)   Wt 99.8 kg   SpO2 98%   BMI 31.57 kg/m  Physical Exam Vitals and nursing note reviewed.  Constitutional:      General: He is not in acute distress.    Appearance: Normal appearance.  HENT:     Head: Normocephalic and atraumatic.     Comments: Patient with significantly swollen right ear canal, there is redness, irritation noted on the external ear canal, however cannot see into the tympanic membrane, or auditory canal secondary to swelling.  No purulent drainage noted. Eyes:     General:        Right eye: No discharge.        Left eye: No discharge.  Cardiovascular:     Rate and Rhythm: Normal rate and regular rhythm.  Pulmonary:     Effort: Pulmonary effort is normal. No respiratory distress.  Musculoskeletal:        General: No deformity.  Skin:    General: Skin is warm and dry.  Neurological:     Mental Status: He is alert and oriented to person, place, and time.  Psychiatric:        Mood and Affect: Mood normal.        Behavior: Behavior normal.     ED Results / Procedures / Treatments   Labs (all labs ordered are listed, but only abnormal results are displayed) Labs Reviewed  CBC -  Abnormal; Notable for the following components:      Result Value   WBC 17.0 (*)    All other components within normal limits  BASIC METABOLIC PANEL    EKG None  Radiology CT Temporal Bones W Contrast  Result Date: 05/08/2022 CLINICAL DATA:  36 year old male with right ear infection for 1 week, not improving despite antibiotic ear drops. Leukocytosis. EXAM: CT TEMPORAL BONES WITH CONTRAST TECHNIQUE: Axial and coronal plane CT imaging of the petrous temporal bones was performed with thin-collimation image reconstruction following intravenous contrast administration. Multiplanar CT image reconstructions were also generated. RADIATION DOSE REDUCTION: This exam was performed according to the departmental dose-optimization program which includes automated exposure control, adjustment of the mA and/or kV according to patient size and/or use of iterative reconstruction technique. CONTRAST:  75mL OMNIPAQUE IOHEXOL 300 MG/ML  SOLN COMPARISON:  Neck CT 12/28/2015. FINDINGS: RIGHT TEMPORAL BONE Severely swollen and diffusely opacified right side external auditory canal except for a small area just superficial to the tympanic membrane (coronal series 9, image 72). Bony walls of the canal appear to remain intact. Thickened and/or indistinct right tympanic membrane. Subtotal opacification of the right tympanic cavity. However, the right scutum and ossicles appear to remain intact and aligned. Air-fluid level in the right mastoid antrum and scattered right mastoid air cell opacification and fluid levels. But no mastoid erosion or coalescence identified. Right internal auditory canal, cochlea, vestibule and vestibular aqueduct are within normal limits. Right side semicircular canals and the right stylomastoid foramen appear normal. LEFT TEMPORAL BONE Left EAC, tympanic membrane and tympanic cavity appear clear, normal. Left scutum and ossicles appear intact and aligned. Left mastoid antrum and air cells are clear,  normal. Left internal auditory canal, cochlea, vestibule and vestibular aqueduct are left external auditory canal within normal limits. Left side semicircular canals and course of the 7th nerve are within normal limits. Vascular: Major vascular structures in the upper neck and at the skull base are enhancing and appear patent including the right sigmoid sinus and IJ bulb, which appear non dominant as before. Dominant right vertebral artery also noted (normal variant). Limited intracranial:  Negative. Visible orbits/paranasal sinuses: Negative orbits. Paranasal sinuses are clear. Soft tissues: Moderate, confluent right periauricular soft tissue swelling and inflammation, including postauricular and right parotid space involvement. But the right pinna does not appear significantly swollen compared to the left. No soft tissue gas identified. Small but reactive appearing regional lymph nodes including right parotid nodes. Visible right masticator, parapharyngeal spaces, and other deep soft tissue spaces of the visible bilateral neck remain within normal limits. IMPRESSION: 1.  Severe soft tissue swelling of the Right external auditory canal with regional cellulitis and reactive appearing lymph nodes. Near complete effacement of the right EAC, and subtotal opacification of the right tympanic cavity and mastoids with fluid levels. But no associated bone or ossicle erosion. No pinna enlargement. And no mastoid abscess or other complicating features. Favor a severe Infectious Otitis Externa. A soft tissue tumor/external auditory carcinoma is not favored but might have a similar appearance. 2. Normal contralateral left temporal bone. Visible paranasal sinuses are clear. Electronically Signed   By: Odessa Fleming M.D.   On: 05/08/2022 08:01    Procedures Procedures    Medications Ordered in ED Medications  ketorolac (TORADOL) 15 MG/ML injection 15 mg (15 mg Intravenous Given 05/08/22 0644)  iohexol (OMNIPAQUE) 300 MG/ML  solution 75 mL (75 mLs Intravenous Contrast Given 05/08/22 0739)  ciprofloxacin-dexamethasone (CIPRODEX) 0.3-0.1 % OTIC (EAR) suspension 4 drop (4 drops Right EAR Given 05/08/22 0818)  levofloxacin (LEVAQUIN) tablet 750 mg (750 mg Oral Given 05/08/22 4627)    ED Course/ Medical Decision Making/ A&P Clinical Course as of 05/08/22 2319  Wed May 08, 2022  0656 Assumed care.  36 year old male with a history of otitis externa who presents to the emergency department with worsening pain and mastoid tenderness on exam.  Obtaining CT temporal bones to evaluate for mastoiditis.  Patient will require ear wick and continued eardrops if negative. [RP]  0809 CT scan shows evidence of severe otitis externa but no evidence of mastoiditis. [RP]  0907 Patient reevaluated.  Reports persistent pain.  Still having swelling of the pinna of the ear no swelling of the mastoid.  No meningismus.  Will place ear wick now.  Due to concerns for perichondritis will treat with Levaquin p.o. with continued Ciprodex drops and have him follow-up with ENT.  Return precautions discussed prior to discharge. [RP]    Clinical Course User Index [RP] Rondel Baton, MD                           Medical Decision Making Amount and/or Complexity of Data Reviewed Labs: ordered. Radiology: ordered.  Risk Prescription drug management.   This patient is a 36 y.o. male who presents to the ED for concern of right ear pain, worsening after dx with AOE, taking ciprodex drops, this involves an extensive number of treatment options, and is a complaint that carries with it a high risk of complications and morbidity. The emergent differential diagnosis prior to evaluation includes, but is not limited to,  developing AOM, mastoiditis, or other worsening ENT infection vs ongoing AOE vs other.   This is not an exhaustive differential.   Past Medical History / Co-morbidities / Social History: ADHD, Bipolar  Physical Exam: Physical exam  performed. The pertinent findings include: Significant swelling of the right external auditory canal, cannot visualize deeper into the auditory canal secondary to edema, he has some significant tenderness of the mastoid bone on that side  11:19 PM Care of Eric Logan transferred to  Dr. Eloise Harman at the end of my shift as the patient will require reassessment once labs/imaging have resulted. Patient presentation, ED course, and plan of care discussed with review of all pertinent labs and imaging. Please see his/her note for further details regarding further ED course and disposition. Plan at time of handoff is patient is pending lab work, imaging of the right ear to evaluate for mastoiditis or other significant worsening of acute otitis  media, he will need stronger antibiotic treatment if any evidence of developing mastoiditis is seen, otherwise will need ear wick placement for continued treatment of AOE, will need ENT follow-up. This may be altered or completely changed at the discretion of the oncoming team pending results of further workup.   Final Clinical Impression(s) / ED Diagnoses Final diagnoses:  Acute diffuse otitis externa of right ear  Perichondritis of auricle, right    Rx / DC Orders ED Discharge Orders          Ordered    levofloxacin (LEVAQUIN) 500 MG tablet  Daily        05/08/22 0902              Olene Floss, PA-C 05/08/22 2319    Nira Conn, MD 05/09/22 [phone]
